# Patient Record
Sex: Male | Born: 1975 | Race: White | Hispanic: No | Marital: Married | Smoking: Never smoker
Health system: Southern US, Community
[De-identification: ages and names within clinical notes are randomized; demographics above are authoritative.]

## PROBLEM LIST (undated history)

## (undated) ENCOUNTER — Inpatient Hospital Stay (AMBULATORY_SURGERY_CENTER): Payer: 59 | Admitting: Podiatry

## (undated) DIAGNOSIS — Z86718 Personal history of other venous thrombosis and embolism: Secondary | ICD-10-CM

## (undated) DIAGNOSIS — K589 Irritable bowel syndrome without diarrhea: Secondary | ICD-10-CM

## (undated) HISTORY — DX: Irritable bowel syndrome, unspecified: K58.9

## (undated) HISTORY — PX: VASECTOMY: SHX75

## (undated) HISTORY — DX: Personal history of other venous thrombosis and embolism: Z86.718

## (undated) HISTORY — PX: SHOULDER SURGERY: SHX246

## (undated) HISTORY — PX: KNEE ARTHROSCOPY W/ MENISCAL REPAIR: SHX1877

## (undated) HISTORY — PX: ANTERIOR CRUCIATE LIGAMENT REPAIR: SHX115

## (undated) HISTORY — PX: ANKLE SURGERY: SHX546

---

## 1997-11-12 ENCOUNTER — Ambulatory Visit (HOSPITAL_COMMUNITY): Admission: RE | Admit: 1997-11-12 | Discharge: 1997-11-12 | Payer: Self-pay

## 1999-03-20 ENCOUNTER — Encounter: Payer: Self-pay | Admitting: Emergency Medicine

## 1999-03-20 ENCOUNTER — Emergency Department (HOSPITAL_COMMUNITY): Admission: EM | Admit: 1999-03-20 | Discharge: 1999-03-20 | Payer: Self-pay | Admitting: Emergency Medicine

## 1999-04-14 ENCOUNTER — Ambulatory Visit (HOSPITAL_BASED_OUTPATIENT_CLINIC_OR_DEPARTMENT_OTHER): Admission: RE | Admit: 1999-04-14 | Discharge: 1999-04-14 | Payer: Self-pay | Admitting: Orthopaedic Surgery

## 1999-09-01 ENCOUNTER — Ambulatory Visit (HOSPITAL_BASED_OUTPATIENT_CLINIC_OR_DEPARTMENT_OTHER): Admission: RE | Admit: 1999-09-01 | Discharge: 1999-09-01 | Payer: Self-pay | Admitting: Orthopaedic Surgery

## 2010-06-09 ENCOUNTER — Observation Stay (HOSPITAL_COMMUNITY): Admission: EM | Admit: 2010-06-09 | Discharge: 2010-06-09 | Payer: Self-pay | Admitting: Emergency Medicine

## 2010-09-23 LAB — DIFFERENTIAL
Basophils Absolute: 0 10*3/uL (ref 0.0–0.1)
Basophils Relative: 0 % (ref 0–1)
Eosinophils Absolute: 0.1 10*3/uL (ref 0.0–0.7)
Eosinophils Relative: 1 % (ref 0–5)
Lymphocytes Relative: 18 % (ref 12–46)
Lymphs Abs: 1.9 10*3/uL (ref 0.7–4.0)
Monocytes Absolute: 1.5 10*3/uL — ABNORMAL HIGH (ref 0.1–1.0)
Monocytes Relative: 14 % — ABNORMAL HIGH (ref 3–12)
Neutro Abs: 7.1 10*3/uL (ref 1.7–7.7)
Neutrophils Relative %: 67 % (ref 43–77)

## 2010-09-23 LAB — POCT I-STAT, CHEM 8
BUN: 19 mg/dL (ref 6–23)
Calcium, Ion: 1.1 mmol/L — ABNORMAL LOW (ref 1.12–1.32)
Chloride: 104 mEq/L (ref 96–112)
Creatinine, Ser: 1.3 mg/dL (ref 0.4–1.5)
Glucose, Bld: 94 mg/dL (ref 70–99)
HCT: 49 % (ref 39.0–52.0)
Hemoglobin: 16.7 g/dL (ref 13.0–17.0)
Potassium: 4 mEq/L (ref 3.5–5.1)
Sodium: 140 mEq/L (ref 135–145)
TCO2: 27 mmol/L (ref 0–100)

## 2010-09-23 LAB — CBC
HCT: 46 % (ref 39.0–52.0)
Hemoglobin: 16.2 g/dL (ref 13.0–17.0)
MCH: 31.3 pg (ref 26.0–34.0)
MCHC: 35.2 g/dL (ref 30.0–36.0)
MCV: 88.8 fL (ref 78.0–100.0)
Platelets: 248 10*3/uL (ref 150–400)
RBC: 5.18 MIL/uL (ref 4.22–5.81)
RDW: 12.2 % (ref 11.5–15.5)
WBC: 10.6 10*3/uL — ABNORMAL HIGH (ref 4.0–10.5)

## 2010-09-23 LAB — APTT: aPTT: 33 seconds (ref 24–37)

## 2010-09-23 LAB — PROTIME-INR
INR: 1.08 (ref 0.00–1.49)
Prothrombin Time: 14.2 seconds (ref 11.6–15.2)

## 2010-10-08 ENCOUNTER — Inpatient Hospital Stay (INDEPENDENT_AMBULATORY_CARE_PROVIDER_SITE_OTHER)
Admission: RE | Admit: 2010-10-08 | Discharge: 2010-10-08 | Disposition: A | Discharge status: Home / Self Care | Payer: BC Managed Care – PPO | Source: Ambulatory Visit | Attending: Emergency Medicine | Admitting: Emergency Medicine

## 2010-10-08 DIAGNOSIS — T148XXA Other injury of unspecified body region, initial encounter: Secondary | ICD-10-CM

## 2010-11-27 NOTE — Op Note (Signed)
[place]  Patient:    Antonio Davis, Antonio Davis                       MRN: [id number] Proc. Date: 09/01/99 Adm. Date:  [id number] Attending:  Marcene Corning                           Operative Report  PREOPERATIVE DIAGNOSIS:  Left shoulder impingement.  POSTOPERATIVE DIAGNOSIS: 1. Left shoulder impingement. 2. Left shoulder labral tear.  OPERATION PERFORMED: 1. Left shoulder arthroscopic acromioplasty. 2. Left shoulder arthroscopic debridement.  ANESTHESIA:  General.  ATTENDING SURGEON:  Lubertha Basque. Jerl Santos, M.D.  ASSISTANT:  Lindwood Qua, P.A.  INDICATIONS FOR PROCEDURE:  The patient is a 35 year old man many months out from a work-related injury.  He is status post right ACL reconstruction and has been followed for his left shoulder for some time.  He responded in a transient fashion to injection of the subacromial space on two occasions.  He has had an MRI scan  which shows some tendinosis and a downsloping acromion but no frank cuff tear.  Planned procedure at this point is for decompression through the scope.  The procedure was discussed with the patient and informed operative consent was obtained after discussion of possible complications of reaction to anesthesia and infection.  DESCRIPTION OF PROCEDURE:  The patient was taken to an operating suite where general anesthetic was applied without difficulty.  He was then positioned in beach chair position and prepped and draped in normal sterile fashion.  After the administration of preop IV antibiotics, an arthroscopy was performed of the left shoulder through a total of portals.  The glenohumeral joint showed no degenerative change.  He did have a labral tear on the superior aspect involving the anterior portion of the biceps anchor though this structure was well attached.  A debridement of this area was performed.  The middle and inferior glenohumeral ligaments were intact and  healthy appearing.  The rotator cuff appeared benign n undersurface inspection.  The subacromial space was entered and he has a large subacromial spur which was decompressed back to a flat surface done initially with the bur in the lateral position and it was then transferred to the posterior position.  The cuff was thoroughly examined and was found to have some abrasion but no full thickness tear.  The [facility] joint was benign and was not addressed.  The shoulder was thoroughly irrigated at the end of the case followed by placement f Marcaine with epinephrine and morphine.  Simple sutures of nylon were used to reapproximate the three portals followed by Adaptic and a dry gauze dressing with tape.  Estimated blood loss and intraoperative fluids can be obtained from Anesthesia records.  DISPOSITION:  The patient was taken to the recovery room in stable condition. Plans were for him to go home the same day and to follow up in the office in less than a week.  I will contact him by phone tonight. DD:  09/01/99 TD:  09/02/99 Job: [id number]

## 2016-12-09 ENCOUNTER — Ambulatory Visit: Payer: Self-pay | Admitting: Internal Medicine

## 2016-12-09 DIAGNOSIS — Z0289 Encounter for other administrative examinations: Secondary | ICD-10-CM

## 2016-12-15 ENCOUNTER — Ambulatory Visit: Payer: Self-pay | Admitting: Adult Health

## 2017-03-15 ENCOUNTER — Encounter: Payer: Self-pay | Admitting: Adult Health

## 2017-03-15 ENCOUNTER — Ambulatory Visit (INDEPENDENT_AMBULATORY_CARE_PROVIDER_SITE_OTHER): Payer: 59 | Admitting: Adult Health

## 2017-03-15 VITALS — BP 140/88 | HR 86 | Ht 73.0 in | Wt 306.9 lb

## 2017-03-15 DIAGNOSIS — Z131 Encounter for screening for diabetes mellitus: Secondary | ICD-10-CM | POA: Diagnosis not present

## 2017-03-15 DIAGNOSIS — Z9229 Personal history of other drug therapy: Secondary | ICD-10-CM | POA: Diagnosis not present

## 2017-03-15 DIAGNOSIS — Z13220 Encounter for screening for lipoid disorders: Secondary | ICD-10-CM | POA: Insufficient documentation

## 2017-03-15 DIAGNOSIS — K589 Irritable bowel syndrome without diarrhea: Secondary | ICD-10-CM | POA: Insufficient documentation

## 2017-03-15 DIAGNOSIS — K588 Other irritable bowel syndrome: Secondary | ICD-10-CM

## 2017-03-15 DIAGNOSIS — Z0000 Encounter for general adult medical examination without abnormal findings: Secondary | ICD-10-CM | POA: Diagnosis not present

## 2017-03-15 DIAGNOSIS — Z86718 Personal history of other venous thrombosis and embolism: Secondary | ICD-10-CM | POA: Insufficient documentation

## 2017-03-15 NOTE — Assessment & Plan Note (Addendum)
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 Current regime: Coumadin 10mg - M/W/F/Sat. Coumadin 5mg - T/R/Sun.   Last INR 90 days ago, level was 2.5 Referral to coumadin clinic placed.

## 2017-03-15 NOTE — Assessment & Plan Note (Signed)
Was previously treated with Eluxadoline BID, however due to cost has not been on medication in years. He resorts to eating only one meal a day to reduce frequency of diarrhea. He has never been treated/evaluated by GI. He denies family hx of colon ca. He declines refill on Eluxadoline today.

## 2017-03-15 NOTE — Assessment & Plan Note (Signed)
Will check lipids. 

## 2017-03-15 NOTE — Patient Instructions (Signed)
Bleeding Precautions When on Anticoagulant Therapy  WHAT IS ANTICOAGULANT THERAPY? Anticoagulant therapy is taking medicine to prevent or reduce blood clots. It is also called blood thinner therapy. Blood clots that form in your blood vessels can be dangerous. They can break loose and travel to your heart, lungs, or brain. This increases your risk of a heart attack or stroke. Anticoagulant therapy causes blood to clot more slowly. You may need anticoagulant therapy if you have:  A medical condition that increases the likelihood that blood clots will form.  A heart defect or a problem with heart rhythm. It is also a common treatment after heart surgery, such as valve replacement. WHAT ARE COMMON TYPES OF ANTICOAGULANT THERAPY? Anticoagulant medicine can be injected or taken by mouth.If you need anticoagulant therapy quickly at the hospital, the medicine may be injected under your skin or given through an IV tube. Heparin is a common example of an anticoagulant that you may get at the hospital. Most anticoagulant therapy is in the form of pills that you take at home every day. These may include:  Aspirin. This common blood thinner works by preventing blood cells (platelets) from sticking together to form a clot. Aspirin is not as strong as anticoagulants that slow down the time that it takes for your body to form a clot.  Clopidogrel. This is a newer type of drug that affects platelets. It is stronger than aspirin.  Warfarin. This is the most common anticoagulant. It changes the way your body uses vitamin K, a vitamin that helps your blood to clot. The risk of bleeding is higher with warfarin than with aspirin. You will need frequent blood tests to make sure you are taking the safest amount.  New anticoagulants. Several new drugs have been approved. They are all taken by mouth. Studies show that these drugs work as well as warfarin. They do not require blood testing. They may cause less bleeding  risk than warfarin. WHAT DO I NEED TO REMEMBER WHEN TAKING ANTICOAGULANT THERAPY? Anticoagulant therapy decreases your risk of forming a blood clot, but it increases your risk of bleeding. Work closely with your health care provider to make sure you are taking your medicine safely. These tips can help:  Learn ways to reduce your risk of bleeding.  If you are taking warfarin: ? Have blood tests as ordered by your health care provider. ? Do not make any sudden changes to your diet. Vitamin K in your diet can make warfarin less effective. ? Do not get pregnant. This medicine may cause birth defects.  Take your medicine at the same time every day. If you forget to take your medicine, take it as soon as you remember. If you miss a whole day, do not double your dose of medicine. Take your normal dose and call your health care provider to check in.  Do not stop taking your medicine on your own.  Tell your health care provider before you start taking any new medicine, vitamin, or herbal product. Some of these could interfere with your therapy.  Tell all of your health care providers that you are on anticoagulant therapy.  Do not have surgery, medical procedures, or dental work until you tell your health care provider that you are on anticoagulant therapy. WHAT CAN AFFECT HOW ANTICOAGULANTS WORK? Certain foods, vitamins, medicines, supplements, and herbal medicines change the way that anticoagulant therapy works. They may increase or decrease the effects of your anticoagulant therapy. Either result can be dangerous for you.    Many over-the-counter medicines for pain, colds, or stomach problems interfere with anticoagulant therapy. Take these only as told by your health care provider.  Do not drink alcohol. It can interfere with your medicine and increase your risk of an injury that causes bleeding.  If you are taking warfarin, do not begin eating more foods that contain vitamin K. These include  leafy green vegetables. Ask your health care provider if you should avoid any foods. WHAT ARE SOME WAYS TO PREVENT BLEEDING? You can prevent bleeding by taking certain precautions:  Be extra careful when you use knives, scissors, or other sharp objects.  Use an electric razor instead of a blade.  Do not use toothpicks.  Use a soft toothbrush.  Wear shoes that have nonskid soles.  Use bath mats and handrails in your bathroom.  Wear gloves while you do yard work.  Wear a helmet when you ride a bike.  Wear your seat belt.  Prevent falls by removing loose rugs and extension cords from areas where you walk.  Do not play contact sports or participate in other activities that have a high risk of injury. Antonio Davis PROVIDER? Call your health care provider if:  You miss a dose of medicine: ? And you are not sure what to do. ? For more than one day.  You have: ? Menstrual bleeding that is heavier than normal. ? Blood in your urine. ? A bloody nose or bleeding gums. ? Easy bruising. ? Blood in your stool (feces) or have black and tarry stool. ? Side effects from your medicine.  You feel weak or dizzy.  You become pregnant. Seek immediate medical care if:  You have bleeding that will not stop.  You have sudden and severe headache or belly pain.  You vomit or you cough up bright red blood.  You have a severe blow to your head. WHAT ARE SOME QUESTIONS TO ASK MY HEALTH CARE PROVIDER?  What is the best anticoagulant therapy for my condition?  What side effects should I watch for?  When should I take my medicine? What should I do if I forget to take it?  Will I need to have regular blood tests?  Do I need to change my diet? Are there foods or drinks that I should avoid?  What activities are safe for me?  What should I do if I want to get pregnant? This information is not intended to replace advice given to you by your health care provider.  Make sure you discuss any questions you have with your health care provider. Document Released: 06/09/2015 Document Reviewed: 06/09/2015 Elsevier Interactive Patient Education  2017 [employer].   Heart-Healthy Eating Plan Many factors influence your heart health, including eating and exercise habits. Heart (coronary) risk increases with abnormal blood fat (lipid) levels. Heart-healthy meal planning includes limiting unhealthy fats, increasing healthy fats, and making other small dietary changes. This includes maintaining a healthy body weight to help keep lipid levels within a normal range. What is my plan? Your health care provider recommends that you:  Get no more than __25__% of the total calories in your daily diet from fat.  Limit your intake of saturated fat to less than __5___% of your total calories each day.  Limit the amount of cholesterol in your diet to less than __300__ mg per day.  What types of fat should I choose?  Choose healthy fats more often. Choose monounsaturated and polyunsaturated fats, such as olive  oil and canola oil, flaxseeds, walnuts, almonds, and seeds.  Eat more omega-3 fats. Good choices include salmon, mackerel, sardines, tuna, flaxseed oil, and ground flaxseeds. Aim to eat fish at least two times each week.  Limit saturated fats. Saturated fats are primarily found in animal products, such as meats, butter, and cream. Plant sources of saturated fats include palm oil, palm kernel oil, and coconut oil.  Avoid foods with partially hydrogenated oils in them. These contain trans fats. Examples of foods that contain trans fats are stick margarine, some tub margarines, cookies, crackers, and other baked goods. What general guidelines do I need to follow?  Check food labels carefully to identify foods with trans fats or high amounts of saturated fat.  Fill one half of your plate with vegetables and green salads. Eat 4-5 servings of vegetables per day. A  serving of vegetables equals 1 cup of raw leafy vegetables,  cup of raw or cooked cut-up vegetables, or  cup of vegetable juice.  Fill one fourth of your plate with whole grains. Look for the word "whole" as the first word in the ingredient list.  Fill one fourth of your plate with lean protein foods.  Eat 4-5 servings of fruit per day. A serving of fruit equals one medium whole fruit,  cup of dried fruit,  cup of fresh, frozen, or canned fruit, or  cup of 100% fruit juice.  Eat more foods that contain soluble fiber. Examples of foods that contain this type of fiber are apples, broccoli, carrots, beans, peas, and barley. Aim to get 20-30 g of fiber per day.  Eat more home-cooked food and less restaurant, buffet, and fast food.  Limit or avoid alcohol.  Limit foods that are high in starch and sugar.  Avoid fried foods.  Cook foods by using methods other than frying. Baking, boiling, grilling, and broiling are all great options. Other fat-reducing suggestions include: ? Removing the skin from poultry. ? Removing all visible fats from meats. ? Skimming the fat off of stews, soups, and gravies before serving them. ? Steaming vegetables in water or broth.  Lose weight if you are overweight. Losing just 5-10% of your initial body weight can help your overall health and prevent diseases such as diabetes and heart disease.  Increase your consumption of nuts, legumes, and seeds to 4-5 servings per week. One serving of dried beans or legumes equals  cup after being cooked, one serving of nuts equals 1 ounces, and one serving of seeds equals  ounce or 1 tablespoon.  You may need to monitor your salt (sodium) intake, especially if you have high blood pressure. Talk with your health care provider or dietitian to get more information about reducing sodium. What foods can I eat? Grains  Breads, including [place], white, pita, wheat, raisin, rye, oatmeal, and [place]. Tortillas that are  neither fried nor made with lard or trans fat. Low-fat rolls, including hotdog and hamburger buns and English muffins. Biscuits. Muffins. Waffles. Pancakes. Light popcorn. Whole-grain cereals. Flatbread. Melba toast. Pretzels. Breadsticks. Rusks. Low-fat snacks and crackers, including oyster, saltine, matzo, graham, animal, and rye. Rice and pasta, including brown rice and those that are made with whole wheat. Vegetables All vegetables. Fruits All fruits, but limit coconut. Meats and Other Protein Sources Lean, well-trimmed beef, veal, pork, and lamb. Chicken and [place] without skin. All fish and shellfish. Wild duck, rabbit, pheasant, and venison. Egg whites or low-cholesterol egg substitutes. Dried beans, peas, lentils, and tofu.Seeds and most nuts. Dairy  Low-fat or nonfat cheeses, including ricotta, string, and mozzarella. Skim or 1% milk that is liquid, powdered, or evaporated. Buttermilk that is made with low-fat milk. Nonfat or low-fat yogurt. Beverages Mineral water. Diet carbonated beverages. Sweets and Desserts Sherbets and fruit ices. Honey, jam, marmalade, jelly, and syrups. Meringues and gelatins. Pure sugar candy, such as hard candy, jelly beans, gumdrops, mints, marshmallows, and small amounts of dark chocolate. [employer] Eat all sweets and desserts in moderation. Fats and Oils Nonhydrogenated (trans-free) margarines. Vegetable oils, including soybean, sesame, sunflower, olive, peanut, safflower, corn, canola, and cottonseed. Salad dressings or mayonnaise that are made with a vegetable oil. Limit added fats and oils that you use for cooking, baking, salads, and as spreads. Other Cocoa powder. Coffee and tea. All seasonings and condiments. The items listed above may not be a complete list of recommended foods or beverages. Contact your dietitian for more options. What foods are not recommended? Grains Breads that are made with saturated or trans fats, oils, or whole milk.  Croissants. Butter rolls. Cheese breads. Sweet rolls. Donuts. Buttered popcorn. Chow mein noodles. High-fat crackers, such as cheese or butter crackers. Meats and Other Protein Sources Fatty meats, such as hotdogs, short ribs, sausage, spareribs, bacon, ribeye roast or steak, and mutton. High-fat deli meats, such as salami and bologna. Caviar. Domestic duck and goose. Organ meats, such as kidney, liver, sweetbreads, brains, gizzard, chitterlings, and heart. Dairy Cream, sour cream, cream cheese, and creamed cottage cheese. Whole milk cheeses, including blue (bleu), Monterey Jack, [place], [place], American, [place], Swiss, [place], [place], and [place]. Whole or 2% milk that is liquid, evaporated, or condensed. Whole buttermilk. Cream sauce or high-fat cheese sauce. Yogurt that is made from whole milk. Beverages Regular sodas and drinks with added sugar. Sweets and Desserts Frosting. Pudding. Cookies. Cakes other than angel food cake. Candy that has milk chocolate or white chocolate, hydrogenated fat, butter, coconut, or unknown ingredients. Buttered syrups. Full-fat ice cream or ice cream drinks. Fats and Oils Gravy that has suet, meat fat, or shortening. Cocoa butter, hydrogenated oils, palm oil, coconut oil, palm kernel oil. These can often be found in baked products, candy, fried foods, nondairy creamers, and whipped toppings. Solid fats and shortenings, including bacon fat, salt pork, lard, and butter. Nondairy cream substitutes, such as coffee creamers and sour cream substitutes. Salad dressings that are made of unknown oils, cheese, or sour cream. The items listed above may not be a complete list of foods and beverages to avoid. Contact your dietitian for more information. This information is not intended to replace advice given to you by your health care provider. Make sure you discuss any questions you have with your health care provider. Document Released: 04/06/2008 Document Revised: 01/16/2016  Document Reviewed: 12/20/2013 Elsevier Interactive Patient Education  2017 [employer].  Please continue Coumadin as directed. Referral for coumadin clinic placed. Keep up the excellent water intake and try to eat at least 3 meals/day. Please schedule physical with fasting labs this fall. WELCOME TO THE PRACTICE!

## 2017-03-15 NOTE — Assessment & Plan Note (Signed)
Coumadin clinic referral placed. Heart healthy diet and increase regular exercise. Continue to abstain from EOTH/tobacco use. Recommend CPE with fasting labs this fall.

## 2017-03-15 NOTE — Progress Notes (Signed)
Subjective:    Patient ID: Antonio Davis, male    DOB: 14-Mar-1976, 41 y.o.   MRN: [id number]  HPI:  Antonio Davis, Antonio Davis is here to establish as a new pt.  He is a very pleasant 41 year old male. 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  He has sig family hx of clotting disorders- father and brother.  Due to this he has opted to remain on Coumadin lifelong.  His current therapy: Coumadin 10mg - M/W/F/Sat. Coumadin 5mg - T/R/Sun.  He has been therapeutic for years and his INR typically runs 2-2.5, last check was 90 days ago and level was 2.5.  His INRs were only being monitoring every 90 days. He denies tobacco/EOTH use.  He reports drinking >6L water/day and only eats one meal a day.  He denies tobacco/EOTH use.  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 .  Patient Care Team    Relationship Specialty Notifications Start End  [place], Berna Spare, NP PCP - General Family Medicine  03/10/17     Patient Active Problem List   Diagnosis Date Noted  . History of blood clots 03/15/2017  . IBS (irritable bowel syndrome) 03/15/2017  . History of Coumadin therapy 03/15/2017  . Screening cholesterol level 03/15/2017  . Screening for diabetes mellitus 03/15/2017  . Healthcare maintenance 03/15/2017     Past Medical History:  Diagnosis Date  . History of blood clots   . IBS (irritable bowel syndrome)      Past Surgical History:  Procedure Laterality Date  . KNEE ARTHROSCOPY W/ MENISCAL REPAIR       Family History  Problem Relation Age of Onset  . Clotting disorder Father   . Clotting disorder Brother      History  Drug Use No     History  Alcohol Use No     History  Smoking Status  . Never Smoker  Smokeless Tobacco  . Never Used     Outpatient Encounter Prescriptions as of 03/15/2017  Medication Sig  . warfarin (COUMADIN) 5 MG tablet Take 5 mg by mouth daily. 10 mg MWF and Sat  5 mg Tue, Thurs and Sun  . [DISCONTINUED]  Eluxadoline (VIBERZI PO) Take 1 tablet by mouth as needed.   No facility-administered encounter medications on file as of 03/15/2017.     Allergies: Patient has no allergy information on record.  Body mass index is 40.49 kg/m.  Blood pressure 140/88, pulse 86, height 6\' 1"  (1.854 m), weight (!) 306 lb 14.4 oz (139.2 kg).     Review of Systems  Constitutional: Positive for fatigue. Negative for activity change, appetite change, chills, diaphoresis, fever and unexpected weight change.  HENT: Negative.   Eyes: Negative.   Respiratory: Negative for cough, chest tightness, shortness of breath, wheezing and stridor.   Cardiovascular: Negative for chest pain, palpitations and leg swelling.  Gastrointestinal: Negative.   Endocrine: Negative.   Genitourinary: Negative.   Musculoskeletal: Negative.   Skin: Negative.   Allergic/Immunologic: Negative.   Neurological: Negative for dizziness, tremors, weakness and headaches.  Hematological: Bruises/bleeds easily.  Psychiatric/Behavioral: Positive for sleep disturbance. Negative for dysphoric mood. The patient is not nervous/anxious.        Objective:   Physical Exam  Constitutional: He is oriented to person, place, and time. He appears well-developed and well-nourished. No distress.  HENT:  Head: Normocephalic and atraumatic.  Right Ear: External ear normal.  Left Ear: External ear normal.  Eyes:  Pupils are equal, round, and reactive to light. Conjunctivae are normal.  Neck: Normal range of motion. Neck supple.  Cardiovascular: Normal rate, regular rhythm, normal heart sounds and intact distal pulses.   No murmur heard. Pulmonary/Chest: Effort normal and breath sounds normal. No respiratory distress. He has no wheezes. He has no rales. He exhibits no tenderness.  Lymphadenopathy:    He has no cervical adenopathy.  Neurological: He is alert and oriented to person, place, and time.  Skin: Skin is warm and dry. No rash noted. He is not  diaphoretic. No erythema. No pallor.  Psychiatric: He has a normal mood and affect. His behavior is normal. Judgment and thought content normal.  Nursing note and vitals reviewed.         Assessment & Plan:   1. Screening for diabetes mellitus   2. History of blood clots   3. Other irritable bowel syndrome   4. Screening cholesterol level   5. History of Coumadin therapy   6. Healthcare maintenance     IBS (irritable bowel syndrome) Was previously treated with Eluxadoline BID, however due to cost has not been on medication in years. He resorts to eating only one meal a day to reduce frequency of diarrhea. He has never been treated/evaluated by GI. He denies family hx of colon ca. He declines refill on Eluxadoline today.  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 Current regime: Coumadin 10mg - M/W/F/Sat. Coumadin 5mg - T/R/Sun.   Last INR 90 days ago, level was 2.5 Referral to coumadin clinic placed.    Screening cholesterol level Will check lipids  Screening for diabetes mellitus Will check A1c  Healthcare maintenance Coumadin clinic referral placed. Heart healthy diet and increase regular exercise. Continue to abstain from EOTH/tobacco use. Recommend CPE with fasting labs this fall.     FOLLOW-UP:  Return in about 4 weeks (around 04/12/2017) for CPE, Fasting Lab Draw.

## 2017-03-15 NOTE — Assessment & Plan Note (Signed)
Will check A1c

## 2017-04-04 NOTE — Progress Notes (Signed)
Noted.  Pt has appt with Coumadin clinic on 04/12/17.  Charyl Bigger, CMA

## 2017-04-06 ENCOUNTER — Other Ambulatory Visit: Payer: Self-pay | Admitting: Adult Health

## 2017-04-06 NOTE — Telephone Encounter (Signed)
Pt's wife clld states Patient should have advised provider he was out of his Warfarin 5 MG (takes 2 daily) Rx-(prior provider was prescriber/ Not  Katy)-- Pt uses [facility] on [address] as pharmacy -- pls cll patient if any questions--- He is out complete and having leg cramps ( hx of blood clots) --glh

## 2017-04-07 NOTE — Telephone Encounter (Signed)
We have not prescribed these medications for the patient previously.  Please review and refill if appropriate.  T. Talesha Ellithorpe, CMA  

## 2017-04-08 ENCOUNTER — Other Ambulatory Visit: Payer: Self-pay | Admitting: Adult Health

## 2017-04-08 DIAGNOSIS — Z86718 Personal history of other venous thrombosis and embolism: Secondary | ICD-10-CM

## 2017-04-08 MED ORDER — WARFARIN SODIUM 5 MG PO TABS: 5.0000 mg | ORAL_TABLET | Freq: Every day | ORAL | 0 refills | Status: DC

## 2017-04-08 NOTE — Progress Notes (Unsigned)
He has appt with [facility] 04/12/2017 Refill sent in, will be looking for INR next week. Will need current INR prior to any refills.

## 2017-04-08 NOTE — Telephone Encounter (Signed)
Morning Odette Fraction and I verified that he has appt with [facility] 04/12/2017. I sent in refill, however we will need INRs at LEAST every 90 days prior to any additional refills. Can you please call and let Mr. Panas aware. Thanks! Valetta Fuller

## 2017-04-11 NOTE — Telephone Encounter (Signed)
Pt made aware.  Pt expressed understanding.  Charyl Bigger, CMA

## 2017-04-12 ENCOUNTER — Encounter: Payer: Self-pay | Admitting: Physician Assistant

## 2017-04-12 ENCOUNTER — Ambulatory Visit (INDEPENDENT_AMBULATORY_CARE_PROVIDER_SITE_OTHER): Payer: 59 | Admitting: *Deleted

## 2017-04-12 ENCOUNTER — Ambulatory Visit (INDEPENDENT_AMBULATORY_CARE_PROVIDER_SITE_OTHER): Payer: 59 | Admitting: Physician Assistant

## 2017-04-12 VITALS — BP 136/60 | HR 84 | Ht 73.0 in | Wt 306.8 lb

## 2017-04-12 DIAGNOSIS — Z7901 Long term (current) use of anticoagulants: Secondary | ICD-10-CM | POA: Diagnosis not present

## 2017-04-12 DIAGNOSIS — Z9229 Personal history of other drug therapy: Secondary | ICD-10-CM | POA: Diagnosis not present

## 2017-04-12 DIAGNOSIS — I82499 Acute embolism and thrombosis of other specified deep vein of unspecified lower extremity: Secondary | ICD-10-CM

## 2017-04-12 DIAGNOSIS — Z5181 Encounter for therapeutic drug level monitoring: Secondary | ICD-10-CM

## 2017-04-12 DIAGNOSIS — I82409 Acute embolism and thrombosis of unspecified deep veins of unspecified lower extremity: Secondary | ICD-10-CM | POA: Insufficient documentation

## 2017-04-12 DIAGNOSIS — Z86718 Personal history of other venous thrombosis and embolism: Secondary | ICD-10-CM | POA: Diagnosis not present

## 2017-04-12 DIAGNOSIS — D6859 Other primary thrombophilia: Secondary | ICD-10-CM | POA: Insufficient documentation

## 2017-04-12 LAB — POCT INR: INR: 1.9

## 2017-04-12 NOTE — Patient Instructions (Signed)
Medication Instructions:  No changes  Labwork: None   Testing/Procedures: None   Follow-Up: As needed with Cardiology Richardson Dopp, PA-C).  Any Other Special Instructions Will Be Listed Below (If Applicable).  If you need a refill on your cardiac medications before your next appointment, please call your pharmacy.

## 2017-04-12 NOTE — Patient Instructions (Signed)

## 2017-04-12 NOTE — Progress Notes (Signed)
Cardiology Office Note:    Date:  04/12/2017   ID:  Antonio Davis, DOB 02/28/1976, MRN [id number]  PCP:  Esaw Grandchild, NP  Cardiologist:  New   Referring MD: Esaw Grandchild, NP   Chief Complaint  Patient presents with  . Anticoagulation    History of Present Illness:    Antonio Davis is a 41 y.o. 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   Mr. Linck has a hx of R leg DVT in 2010. He has a strong family history of hypercoagulable disorder.  Therefore, he has remained on Coumadin since.  He has no complaints.  He has not had chest pain, shortness of breath, syncope, paroxysmal nocturnal dyspnea, significant edema.   PAD Screen 04/12/2017  Previous PAD dx? No  Previous surgical procedure? No  Pain with walking? No  Feet/toe relief with dangling? No  Painful, non-healing ulcers? No  Extremities discolored? No    Prior CV studies:   The following studies were reviewed today:  None   Past Medical History:  Diagnosis Date  . History of DVT (deep vein thrombosis)    R calf; 2010; on Coumadin since  . IBS (irritable bowel syndrome)     Past Surgical History:  Procedure Laterality Date  . ANTERIOR CRUCIATE LIGAMENT REPAIR Bilateral   . KNEE ARTHROSCOPY W/ MENISCAL REPAIR    . SHOULDER SURGERY     2/2 fracture    Current Medications: Current Meds  Medication Sig  . warfarin (COUMADIN) 5 MG tablet Take 1 tablet (5 mg total) by mouth daily. 10 mg MWF and Sat  5 mg Tue, Thurs and Sun     Allergies:   Patient has no known allergies.   Social History   Social History  . Marital status: Married    Spouse name: N/A  . Number of children: N/A  . Years of education: N/A   Social History Main Topics  . Smoking status: Never Smoker  . Smokeless tobacco: Never Used  . Alcohol use No    . Drug use: No  . Sexual activity: Not Asked   Other Topics Concern  . None   Social History Narrative   Works for [employer], maintenance   Married.  In process of adopting child.        Family Hx: The patient's family history includes Clotting disorder in his brother and father.  ROS:   Please see the history of present illness.    ROS All other systems reviewed and are negative.   EKGs/Labs/Other Test Reviewed:    EKG:  EKG is not ordered today.  The ekg ordered today demonstrates n/a  Recent Labs: No results found for requested labs within last 8760 hours.   Recent Lipid Panel No results found for: CHOL, TRIG, HDL, CHOLHDL, LDLCALC, LDLDIRECT  Physical Exam:    VS:  BP 136/60 (BP Location: Left Arm, Patient Position: Sitting, Cuff Size: Large)   Pulse 84   Ht 6\' 1"  (1.854 m)   Wt (!) 306 lb 12.8 oz (139.2 kg)   BMI 40.48 kg/m     Wt Readings from Last 3 Encounters:  04/12/17 (!) 306 lb 12.8 oz (139.2 kg)  03/15/17 (!) 306 lb 14.4 oz (139.2 kg)     Physical Exam  Constitutional: He is  oriented to person, place, and time. He appears well-developed and well-nourished. No distress.  HENT:  Head: Normocephalic and atraumatic.  Eyes: No scleral icterus.  Neck: No JVD present. Carotid bruit is not present.  Cardiovascular: Normal rate and regular rhythm.   No murmur heard. Pulmonary/Chest: Effort normal. He has no rales.  Abdominal: Soft.  Musculoskeletal: He exhibits no edema.  Neurological: He is alert and oriented to person, place, and time.  Skin: Skin is warm and dry.  Psychiatric: He has a normal mood and affect.    ASSESSMENT:    1. History of DVT (deep vein thrombosis)   2. Anticoagulated on Coumadin    PLAN:    In order of problems listed above:  1. History of DVT (deep vein thrombosis) He has been on Coumadin since his first DVT in 2010.  He decided to remain on it due to his family hx of hypercoagulable disorder.   2.  Anticoagulated on Coumadin He prefers to have his Coumadin managed by primary care.  The co-pay is expensive here as we are a Cardiology office.  He has not had Coumadin in several days.  Our Coumadin Clinic will check his INR today, refill his Coumadin and manage his INR until he can get into a primary care office.   Dispo:  Return As needed.   Medication Adjustments/Labs and Tests Ordered: Current medicines are reviewed at length with the patient today.  Concerns regarding medicines are outlined above.  Orders/Tests:  No orders of the defined types were placed in this encounter.  Medication changes: No orders of the defined types were placed in this encounter.  Signed, Richardson Dopp, PA-C  04/12/2017 4:03 PM    [facility] [address], [place], [place]  [address] Phone: [phone]; Fax: [phone]

## 2017-04-15 NOTE — Addendum Note (Signed)
Addended by: Briant Cedar on: 04/15/2017 12:47 PM   Modules accepted: Orders

## 2017-04-18 ENCOUNTER — Ambulatory Visit: Payer: 59 | Admitting: Family Medicine

## 2017-04-19 ENCOUNTER — Encounter: Payer: 59 | Admitting: Adult Health

## 2017-04-21 ENCOUNTER — Other Ambulatory Visit: Payer: Self-pay | Admitting: Adult Health

## 2017-04-21 DIAGNOSIS — Z86718 Personal history of other venous thrombosis and embolism: Secondary | ICD-10-CM

## 2017-04-21 MED ORDER — WARFARIN SODIUM 5 MG PO TABS: ORAL_TABLET | ORAL | 0 refills | Status: DC

## 2017-04-21 NOTE — Progress Notes (Signed)
Good Morning Tonya, Can you please call Mr. Tweed and tell him his last INR was 1.9, just sub therapuetic. Please adjust coumadin- M/W/F/sat- 10mg  T/thurs/saun -5mg  So the change is sat increased from 5 to 10mg  to try and bump his INR between 2-3. Please tell him to avoid vit K+ in diet. He will need to have his INR re-checked in 1 week (can you please put in order) Once we get him back to INR 2-3, we can resume 90 day INRs. Thanks! Moksh Loomer PS- I already updated rx change in system

## 2017-04-25 NOTE — Progress Notes (Signed)
LVM for pt to call to discuss.  T. Aidden Markovic, CMA  

## 2017-04-27 ENCOUNTER — Telehealth: Payer: Self-pay

## 2017-04-27 NOTE — Telephone Encounter (Signed)
Called Antonio Davis and advised him of new Coumadin dosing.  However, Antonio Davis was upset that he had to go to the Coumadin Clinic for an INR and pay a $50 copay.  Advised Antonio Davis that we can do a venipuncture in our office and send to the lab, but that we do not have a POC Coagucheck.  Antonio Davis stated that he is going to transfer his care back to Dr. Rex Kras because they can do an INR in their office.  Mina Marble, NP informed.  She stated that Antonio Davis is NOT to receive any further refills from our office.  Charyl Bigger, CMA

## 2017-04-27 NOTE — Telephone Encounter (Signed)
-----   Message from Fonnie Mu, [place] sent at 04/25/2017 10:31 AM EDT -----   ----- Message ----- From: Esaw Grandchild, NP Sent: 04/21/2017  11:17 AM To: Fonnie Mu, CMA    ----- Message ----- From: Sharmon Revere Sent: 04/12/2017   4:03 PM To: Esaw Grandchild, NP

## 2018-01-24 ENCOUNTER — Ambulatory Visit: Payer: 59 | Admitting: Podiatry

## 2018-01-24 ENCOUNTER — Encounter: Payer: Self-pay | Admitting: Podiatry

## 2018-01-24 ENCOUNTER — Encounter: Payer: Self-pay | Admitting: *Deleted

## 2018-01-24 ENCOUNTER — Ambulatory Visit (INDEPENDENT_AMBULATORY_CARE_PROVIDER_SITE_OTHER): Payer: 59

## 2018-01-24 VITALS — BP 153/111 | HR 72 | Temp 97.9°F | Resp 16 | Ht 74.0 in | Wt 300.0 lb

## 2018-01-24 DIAGNOSIS — M7661 Achilles tendinitis, right leg: Secondary | ICD-10-CM

## 2018-01-24 DIAGNOSIS — R269 Unspecified abnormalities of gait and mobility: Secondary | ICD-10-CM

## 2018-01-24 DIAGNOSIS — M25571 Pain in right ankle and joints of right foot: Secondary | ICD-10-CM

## 2018-01-24 DIAGNOSIS — R600 Localized edema: Secondary | ICD-10-CM

## 2018-01-24 MED ORDER — METHYLPREDNISOLONE 4 MG PO TBPK: ORAL_TABLET | ORAL | 0 refills | Status: DC

## 2018-01-24 NOTE — Patient Instructions (Signed)

## 2018-01-24 NOTE — Progress Notes (Signed)
  Subjective:  Patient ID: Antonio Davis, male    DOB: 03/28/1976,  MRN: [id number]  Chief Complaint  Patient presents with  . Foot Pain    R back heel pain x couple months; 7/10 sharp constant pain Tx: icing Pt. stated," the pain is worst when I stand up it feels like a tendon is stretching."     42 y.o. male presents with the above complaint. Reports R heel pain for a few months. 7/10 sharp constant pain. Has been icing. Worst pain when he stands up feels like the tendon is tight. On Coumadin for hx of DVT, endorses unknown inherited clotting disorder.  Past Medical History:  Diagnosis Date  . History of DVT (deep vein thrombosis)    R calf; 2010; on Coumadin since  . IBS (irritable bowel syndrome)    Past Surgical History:  Procedure Laterality Date  . ANTERIOR CRUCIATE LIGAMENT REPAIR Bilateral   . KNEE ARTHROSCOPY W/ MENISCAL REPAIR    . SHOULDER SURGERY     2/2 fracture    Current Outpatient Medications:  .  warfarin (COUMADIN) 5 MG tablet, 10 mg MWF, sat and 5 mg Tue, Thurs and Sun, Disp: 140 tablet, Rfl: 0  No Known Allergies Review of Systems: Negative except as noted in the HPI. Denies N/V/F/Ch. Objective:  There were no vitals filed for this visit. General AA&O x3. Normal mood and affect.  Vascular Dorsalis pedis and posterior tibial pulses  present 2+ bilaterally  Capillary refill normal to all digits. Pedal hair growth normal.  Neurologic Epicritic sensation grossly present.  Dermatologic No open lesions. Interspaces clear of maceration. Nails well groomed and normal in appearance.  Orthopedic: MMT 5/5 in dorsiflexion, plantarflexion, inversion, and eversion. Decreased AJ ROM Right with pain on end ROM. POP posterior Achilles tendon insertion. No pain at watershed area. Haglund deformity noted.   XR taken and reviewed. Posterior spurring of the calcaneus noted. Haglund deformity noted.  Assessment & Plan:  Patient was evaluated and treated and all questions  answered.  Achilles Tendonitis, right -XR reviewed as above. -Educated on stretching and icing of the affected limb. -Unna boot and CAM boot applied for immobilization post-injection. Written off from work for the remainder of the week. -eRx for Medrol 6-day taper. Advised on how to take medication. -Avoid NSAIDs due to bleeding risk.  Return in about 3 weeks (around 02/14/2018) for Achilles tendonitis.

## 2018-01-24 NOTE — Progress Notes (Signed)
   Subjective:    Patient ID: Antonio Davis, male    DOB: March 10, 1976, 42 y.o.   MRN: [id number]  HPI    Review of Systems  Musculoskeletal: Positive for joint swelling and myalgias.  All other systems reviewed and are negative.      Objective:   Physical Exam        Assessment & Plan:

## 2018-01-31 ENCOUNTER — Other Ambulatory Visit: Payer: Self-pay | Admitting: Podiatry

## 2018-01-31 DIAGNOSIS — M25571 Pain in right ankle and joints of right foot: Secondary | ICD-10-CM

## 2018-01-31 DIAGNOSIS — M7661 Achilles tendinitis, right leg: Secondary | ICD-10-CM

## 2018-01-31 DIAGNOSIS — R600 Localized edema: Secondary | ICD-10-CM

## 2018-01-31 DIAGNOSIS — R269 Unspecified abnormalities of gait and mobility: Secondary | ICD-10-CM

## 2018-02-03 ENCOUNTER — Telehealth: Payer: Self-pay | Admitting: *Deleted

## 2018-02-03 NOTE — Telephone Encounter (Signed)
Erroneous in counter

## 2018-02-06 ENCOUNTER — Ambulatory Visit: Payer: 59 | Admitting: Podiatry

## 2018-02-06 DIAGNOSIS — M7730 Calcaneal spur, unspecified foot: Secondary | ICD-10-CM

## 2018-02-06 DIAGNOSIS — M659 Unspecified synovitis and tenosynovitis, unspecified site: Secondary | ICD-10-CM

## 2018-02-06 DIAGNOSIS — M7661 Achilles tendinitis, right leg: Secondary | ICD-10-CM

## 2018-02-06 MED ORDER — DICLOFENAC SODIUM 1 % TD GEL: 2.0000 g | Freq: Four times a day (QID) | TRANSDERMAL | 1 refills | Status: DC

## 2018-02-07 NOTE — Progress Notes (Signed)
  Subjective:  Patient ID: SELIG Davis, male    DOB: 10-30-1975,  MRN: [id number]  Chief Complaint  Patient presents with  . Tendonitis    F/U L tendonitis Pt.s tated," wearing the boot helps alot, but when I'm not wearing it it gets sore." Tx: cam boot, stretching, and icing   42 y.o. male returns with the above complaint.  Concerned that he still having pain when he is not wearing the boot.  Has been using lidocaine patches additionally which do help a little bit  Past Medical History:  Diagnosis Date  . History of DVT (deep vein thrombosis)    R calf; 2010; on Coumadin since  . IBS (irritable bowel syndrome)    Past Surgical History:  Procedure Laterality Date  . ANTERIOR CRUCIATE LIGAMENT REPAIR Bilateral   . KNEE ARTHROSCOPY W/ MENISCAL REPAIR    . SHOULDER SURGERY     2/2 fracture    Current Outpatient Medications:  .  diclofenac sodium (VOLTAREN) 1 % GEL, Apply 2 g topically 4 (four) times daily., Disp: 100 g, Rfl: 1 .  methylPREDNISolone (MEDROL DOSEPAK) 4 MG TBPK tablet, 6 Day Taper Pack. Take as Directed., Disp: 21 tablet, Rfl: 0 .  warfarin (COUMADIN) 5 MG tablet, 10 mg MWF, sat and 5 mg Tue, Thurs and Sun, Disp: 140 tablet, Rfl: 0  No Known Allergies Review of Systems: Negative except as noted in the HPI. Denies N/V/F/Ch. Objective:  There were no vitals filed for this visit. General AA&O x3. Normal mood and affect.  Vascular Dorsalis pedis and posterior tibial pulses  present 2+ bilaterally  Capillary refill normal to all digits. Pedal hair growth normal.  Neurologic Epicritic sensation grossly present.  Dermatologic No open lesions. Interspaces clear of maceration. Nails well groomed and normal in appearance.  Orthopedic: MMT 5/5 in dorsiflexion, plantarflexion, inversion, and eversion. Decreased AJ ROM Right with pain on end ROM. POP posterior Achilles tendon insertion. No pain at watershed area. Haglund deformity noted.   Assessment & Plan:  Patient was  evaluated and treated and all questions answered.  Achilles Tendonitis, right -Discussed continued pharmacologic management.  Will avoid NSAIDs due to continued risk of Coumadin.  Will try Voltaren gel for pain and reduction in inflammation -Refer to PT for stretching and reduction of inflammation -Would still consider possible surgical intervention in the future should pain persist  No follow-ups on file.

## 2018-02-14 ENCOUNTER — Ambulatory Visit: Payer: 59 | Admitting: Podiatry

## 2018-03-21 ENCOUNTER — Ambulatory Visit: Payer: 59 | Admitting: Podiatry

## 2018-03-21 DIAGNOSIS — M7730 Calcaneal spur, unspecified foot: Secondary | ICD-10-CM | POA: Diagnosis not present

## 2018-03-21 DIAGNOSIS — Z6838 Body mass index (BMI) 38.0-38.9, adult: Secondary | ICD-10-CM

## 2018-03-21 DIAGNOSIS — D689 Coagulation defect, unspecified: Secondary | ICD-10-CM

## 2018-03-21 DIAGNOSIS — M216X9 Other acquired deformities of unspecified foot: Secondary | ICD-10-CM

## 2018-03-21 DIAGNOSIS — M7661 Achilles tendinitis, right leg: Secondary | ICD-10-CM | POA: Diagnosis not present

## 2018-03-21 DIAGNOSIS — M659 Synovitis and tenosynovitis, unspecified: Secondary | ICD-10-CM | POA: Diagnosis not present

## 2018-03-21 DIAGNOSIS — M9262 Juvenile osteochondrosis of tarsus, left ankle: Secondary | ICD-10-CM | POA: Diagnosis not present

## 2018-03-21 NOTE — Progress Notes (Signed)
  Subjective:  Patient ID: Antonio Davis, male    DOB: 1976/03/30,  MRN: [id number]  Chief Complaint  Patient presents with  . Tendonitis    Pt. stated," pain is the same, have not changed." Tx: icing and voltaren gel -pt have not done PT    42 y.o. male presents with the above complaint. Did not go to PT as he was told by his wife it would make a difference.  Has been icing and occasionally using Voltaren gel.  Would like to discuss possible surgery.  Review of Systems: Negative except as noted in the HPI. Denies N/V/F/Ch.  Past Medical History:  Diagnosis Date  . History of DVT (deep vein thrombosis)    R calf; 2010; on Coumadin since  . IBS (irritable bowel syndrome)     Current Outpatient Medications:  .  diclofenac sodium (VOLTAREN) 1 % GEL, Apply 2 g topically 4 (four) times daily., Disp: 100 g, Rfl: 1 .  methylPREDNISolone (MEDROL DOSEPAK) 4 MG TBPK tablet, 6 Day Taper Pack. Take as Directed., Disp: 21 tablet, Rfl: 0 .  warfarin (COUMADIN) 5 MG tablet, 10 mg MWF, sat and 5 mg Tue, Thurs and Sun, Disp: 140 tablet, Rfl: 0  Social History   Tobacco Use  Smoking Status Never Smoker  Smokeless Tobacco Never Used    Allergies  Allergen Reactions  . Talwin [Pentazocine] Other (See Comments)   Objective:  There were no vitals filed for this visit. There is no height or weight on file to calculate BMI. Constitutional Well developed. Well nourished.  Vascular Dorsalis pedis pulses palpable bilaterally. Posterior tibial pulses palpable bilaterally. Capillary refill normal to all digits.  No cyanosis or clubbing noted. Pedal hair growth normal.  Neurologic Normal speech. Oriented to person, place, and time. Epicritic sensation to light touch grossly present bilaterally.  Dermatologic Nails well groomed and normal in appearance. No open wounds. No skin lesions.  Orthopedic: Normal joint ROM without pain or crepitus bilaterally. Haglund deformity right posterior  calcaneus.  Gastrosoleus equinus noted Pain palpation about the posterior calcaneus and at the Achilles tendon.   Radiographs: None today Assessment:   1. Tendonitis, Achilles, right   2. Retrocalcaneal bone spur   3. Synovitis and tenosynovitis   4. Haglund's deformity of left heel   5. Equinus deformity of foot    Plan:  Patient was evaluated and treated and all questions answered.  Right Haglund's deformity -Patient has failed conservative therapy including stretching night splint immobilization and and have amatory gel.  He would like to consider surgery.  Advised physical therapy prior to surgery however patient declined. -Discussed surgical correction of the above deformity.  Would benefit from concomitant gastroc lengthening.  Rationale for procedures explained to patient.  Discussed need for 6 weeks of nonweightbearing.  He will need to stop his Coumadin just prior to surgery but then to continue it after surgery as he has a high risk for DVT and he will be nonweightbearing.  Patient verbalized that he already has a knee scooter and he can do the 6 weeks and not likely  -Advised he go to his PCP for preoperative clearance -Patient has failed all conservative therapy and wishes to proceed with surgical intervention. All risks, benefits, and alternatives discussed with patient. No guarantees given. Consent reviewed and signed by patient. -Planned procedures: Right Haglund's resection with detachment and reattachment of the Achilles tendon; gastrocnemius recession.  Tentative date 05/31/2018.  No follow-ups on file.

## 2018-03-21 NOTE — Patient Instructions (Signed)
Pre-Operative Instructions  Congratulations, you have decided to take an important step towards improving your quality of life.  You can be assured that the doctors and staff at [facility] will be with you every step of the way.  Here are some important things you should know:  1. Plan to be at the surgery center/hospital at least 1 (one) hour prior to your scheduled time, unless otherwise directed by the surgical center/hospital staff.  You must have a responsible adult accompany you, remain during the surgery and drive you home.  Make sure you have directions to the surgical center/hospital to ensure you arrive on time. 2. If you are having surgery at [facility] or [facility] hospitals, you will need a copy of your medical history and physical form from your family physician within one month prior to the date of surgery. We will give you a form for your primary physician to complete.  3. We make every effort to accommodate the date you request for surgery.  However, there are times where surgery dates or times have to be moved.  We will contact you as soon as possible if a change in schedule is required.   4. No aspirin/ibuprofen for one week before surgery.  If you are on aspirin, any non-steroidal anti-inflammatory medications (Mobic, Aleve, Ibuprofen) should not be taken seven (7) days prior to your surgery.  You make take Tylenol for pain prior to surgery.  5. Medications - If you are taking daily heart and blood pressure medications, seizure, reflux, allergy, asthma, anxiety, pain or diabetes medications, make sure you notify the surgery center/hospital before the day of surgery so they can tell you which medications you should take or avoid the day of surgery. 6. No food or drink after midnight the night before surgery unless directed otherwise by surgical center/hospital staff. 7. No alcoholic beverages 24-hours prior to surgery.  No smoking 24-hours prior or 24-hours after  surgery. 8. Wear loose pants or shorts. They should be loose enough to fit over bandages, boots, and casts. 9. Don't wear slip-on shoes. Sneakers are preferred. 10. Bring your boot with you to the surgery center/hospital.  Also bring crutches or a walker if your physician has prescribed it for you.  If you do not have this equipment, it will be provided for you after surgery. 11. If you have not been contacted by the surgery center/hospital by the day before your surgery, call to confirm the date and time of your surgery. 12. Leave-time from work may vary depending on the type of surgery you have.  Appropriate arrangements should be made prior to surgery with your employer. 13. Prescriptions will be provided immediately following surgery by your doctor.  Fill these as soon as possible after surgery and take the medication as directed. Pain medications will not be refilled on weekends and must be approved by the doctor. 14. Remove nail polish on the operative foot and avoid getting pedicures prior to surgery. 15. Wash the night before surgery.  The night before surgery wash the foot and leg well with water and the antibacterial soap provided. Be sure to pay special attention to beneath the toenails and in between the toes.  Wash for at least three (3) minutes. Rinse thoroughly with water and dry well with a towel.  Perform this wash unless told not to do so by your physician.  Enclosed: 1 Ice pack (please put in freezer the night before surgery)   1 Hibiclens skin cleaner     Pre-op instructions  If you have any questions regarding the instructions, please do not hesitate to call our office.  [place]: [address], [place], [place] [address] -- [phone]  [place]: [address] [place], [place] [address] -- [phone]  [place]: [address]  [place], [place] [address] -- [phone]  [place]: [address], [address], [place], [place] [address] -- [phone]  Website:  [email or web address] 

## 2018-03-22 ENCOUNTER — Telehealth: Payer: Self-pay | Admitting: *Deleted

## 2018-03-22 NOTE — Telephone Encounter (Signed)
"  I'm calling for my husband, Antonio Davis, to schedule his surgery."  Did he say where he was having the surgery, [place] location or [facility]?  "It's going to be done in [place]."  Do you have a date in mind?  "Yes, he wants to do it November 20."  November 20 is available.  I will get it scheduled.  He just needs to go online and register with the surgical center via [facility] [employer], instructions are in the brochure that they gave you.

## 2018-03-28 ENCOUNTER — Ambulatory Visit: Payer: 59 | Admitting: Podiatry

## 2018-04-06 ENCOUNTER — Telehealth: Payer: Self-pay | Admitting: *Deleted

## 2018-04-06 MED ORDER — OXYCODONE HCL 5 MG PO TABS: 2.5000 mg | ORAL_TABLET | Freq: Every evening | ORAL | 0 refills | Status: DC | PRN

## 2018-04-06 NOTE — Telephone Encounter (Signed)
Rx for low dose oxycodone for pain to be taken at night. Please inform patient.

## 2018-04-06 NOTE — Telephone Encounter (Signed)
Patients wife called for him wondering if there is anything he can have for pain or sleep?  He hurt his heel last week and is having to continue to work on it.  Even if he can only take it at night it would be something, he has not slept a full night in over a week.  He is just trying to hang on until surgery in November. Please call me.

## 2018-05-02 ENCOUNTER — Encounter: Payer: Self-pay | Admitting: Physician Assistant

## 2018-05-02 ENCOUNTER — Ambulatory Visit (INDEPENDENT_AMBULATORY_CARE_PROVIDER_SITE_OTHER): Payer: 59 | Admitting: Physician Assistant

## 2018-05-02 VITALS — BP 130/80 | HR 74 | Temp 98.6°F | Resp 20 | Ht 73.0 in | Wt 324.0 lb

## 2018-05-02 DIAGNOSIS — Z13220 Encounter for screening for lipoid disorders: Secondary | ICD-10-CM

## 2018-05-02 DIAGNOSIS — Z114 Encounter for screening for human immunodeficiency virus [HIV]: Secondary | ICD-10-CM

## 2018-05-02 DIAGNOSIS — Z130 Encounter for screening for diseases of the blood and blood-forming organs and certain disorders involving the immune mechanism: Secondary | ICD-10-CM

## 2018-05-02 DIAGNOSIS — Z131 Encounter for screening for diabetes mellitus: Secondary | ICD-10-CM

## 2018-05-02 DIAGNOSIS — Z23 Encounter for immunization: Secondary | ICD-10-CM | POA: Diagnosis not present

## 2018-05-02 DIAGNOSIS — I82499 Acute embolism and thrombosis of other specified deep vein of unspecified lower extremity: Secondary | ICD-10-CM

## 2018-05-02 DIAGNOSIS — Z1329 Encounter for screening for other suspected endocrine disorder: Secondary | ICD-10-CM

## 2018-05-02 LAB — POCT INR
INR: 2.7 (ref 2.0–3.0)
PT: 31.9

## 2018-05-02 NOTE — Progress Notes (Signed)
Patient: Antonio Davis, Male    DOB: December 31, 1975, 42 y.o.   MRN: [id number] Visit Date: 05/10/2018  Today's Provider: Trinna Post, PA-C   Chief Complaint  Patient presents with  . New Patient (Initial Visit)   Subjective:    Annual physical exam Antonio Davis is a 42 y.o. male who presents today to establish care. He lives in [place], [place]. Previous patient of Dr. Rex Kras. He works as a Development worker, community.   History of DVT: He reports history of a DVT. He has never completed a hypercoagulable workup. He says his father has had multiple DVTs and his brother has as well. He says his brother went through the hypercoagulable work up and did not find anything but after stopping warfarin, his brother had five blood clots. Patient reports taking warfarin 10 mg M,W,F and Sat. And 5 mg Sun.,Tues, and Thurs. Has seen cardiology for management of anticoagulation, goal INR 2-3.  Right Haglund Deformity: Patient reports that he will have surgery on his right ankle on 06/02/18 with Dr. March Rummage for right Haglund Deformity  ----------------------------------------------------------------   Review of Systems  Constitutional: Negative.   HENT: Negative.   Eyes: Negative.   Respiratory: Negative.   Cardiovascular: Positive for leg swelling.  Gastrointestinal: Negative.   Endocrine: Negative.   Genitourinary: Negative.   Musculoskeletal: Negative.   Skin: Negative.   Allergic/Immunologic: Negative.   Neurological: Negative.   Hematological: Bruises/bleeds easily.  Psychiatric/Behavioral: Negative.     Social History      He  reports that he has never smoked. He has never used smokeless tobacco. He reports that he does not drink alcohol or use drugs.       Social History   Socioeconomic History  . Marital status: Married    Spouse name: Not on file  . Number of children: Not on file  . Years of education: Not on file  . Highest education level: Not on file  Occupational History  . Not on  file  Social Needs  . Financial resource strain: Not on file  . Food insecurity:    Worry: Not on file    Inability: Not on file  . Transportation needs:    Medical: Not on file    Non-medical: Not on file  Tobacco Use  . Smoking status: Never Smoker  . Smokeless tobacco: Never Used  Substance and Sexual Activity  . Alcohol use: No  . Drug use: No  . Sexual activity: Not on file  Lifestyle  . Physical activity:    Days per week: Not on file    Minutes per session: Not on file  . Stress: Not on file  Relationships  . Social connections:    Talks on phone: Not on file    Gets together: Not on file    Attends religious service: Not on file    Active member of club or organization: Not on file    Attends meetings of clubs or organizations: Not on file    Relationship status: Not on file  Other Topics Concern  . Not on file  Social History Narrative   Works for [employer], maintenance   Married.  In process of adopting child.    Past Medical History:  Diagnosis Date  . History of DVT (deep vein thrombosis)    R calf; 2010; on Coumadin since  . IBS (irritable bowel syndrome)      Patient Active Problem List   Diagnosis Date  Noted  . Encounter for therapeutic drug monitoring 04/12/2017  . DVT (deep venous thrombosis) ([place]) 04/12/2017  . Hypercoagulable state ([place]) 04/12/2017  . History of blood clots 03/15/2017  . IBS (irritable bowel syndrome) 03/15/2017  . History of Coumadin therapy 03/15/2017  . Screening cholesterol level 03/15/2017  . Screening for diabetes mellitus 03/15/2017  . Healthcare maintenance 03/15/2017    Past Surgical History:  Procedure Laterality Date  . ANTERIOR CRUCIATE LIGAMENT REPAIR Bilateral   . KNEE ARTHROSCOPY W/ MENISCAL REPAIR    . SHOULDER SURGERY     2/2 fracture  . VASECTOMY      Family History        Family Status  Relation Name Status  . Mother  Deceased  . Father  Alive  . Brother  (Not Specified)         His family history includes Clotting disorder in his brother and father.      Allergies  Allergen Reactions  . Talwin [Pentazocine] Other (See Comments)     Current Outpatient Medications:  .  diclofenac sodium (VOLTAREN) 1 % GEL, Apply 2 g topically 4 (four) times daily., Disp: 100 g, Rfl: 1 .  warfarin (COUMADIN) 5 MG tablet, 10 mg MWF, sat and 5 mg Tue, Thurs and Sun, Disp: 140 tablet, Rfl: 0   Patient Care Team: Paulene Floor as PCP - General (Physician Assistant)      Objective:   Vitals: BP 130/80 (BP Location: Left Arm, Patient Position: Sitting, Cuff Size: Large)   Pulse 74   Temp 98.6 F (37 C) (Oral)   Resp 20   Ht 6\' 1"  (1.854 m)   Wt (!) 324 lb (147 kg)   SpO2 97%   BMI 42.75 kg/m    Vitals:   05/02/18 1500  BP: 130/80  Pulse: 74  Resp: 20  Temp: 98.6 F (37 C)  TempSrc: Oral  SpO2: 97%  Weight: (!) 324 lb (147 kg)  Height: 6\' 1"  (1.854 m)     Physical Exam  Constitutional: He is oriented to person, place, and time. He appears well-developed and well-nourished.  HENT:  Right Ear: External ear normal.  Left Ear: External ear normal.  Nose: Nose normal.  Mouth/Throat: Oropharynx is clear and moist. No oropharyngeal exudate.  Cardiovascular: Normal rate and regular rhythm.  Pulmonary/Chest: Effort normal and breath sounds normal.  Abdominal: Soft. Bowel sounds are normal.  Neurological: He is alert and oriented to person, place, and time.  Skin: Skin is warm and dry.  Psychiatric: He has a normal mood and affect. His behavior is normal.     Depression Screen PHQ 2/9 Scores 05/02/2018 03/15/2017  PHQ - 2 Score 0 0  PHQ- 9 Score 0 -      Assessment & Plan:     Routine Health Maintenance and Physical Exam  Exercise Activities and Dietary recommendations Goals   None     Immunization History  Administered Date(s) Administered  . Influenza,inj,Quad PF,6+ Mos 05/02/2018  . Tdap 05/02/2018    Health Maintenance  Topic Date  Due  . Samul Dada  05/02/2028  . INFLUENZA VACCINE  Completed  . HIV Screening  Completed     Discussed health benefits of physical activity, and encouraged him to engage in regular exercise appropriate for his age and condition.   .1. Deep vein thrombosis (DVT) of other vein of lower extremity, unspecified chronicity, unspecified laterality (HCC)  INR 2.7 today. Continue current dose warfarin. See back in one month  for this in PT/INR clinic. May need pre-op clearance.  - POCT INR  2. Encounter for screening for HIV  - HIV antibody (with reflex)  3. Screening for deficiency anemia  - CBC with Differential  4. Diabetes mellitus screening  - Comprehensive Metabolic Panel (CMET)  5. Thyroid disorder screening  - TSH  6. Screening cholesterol level  - Lipid Profile  7. Need for Tdap vaccination  - Tdap vaccine greater than or equal to 7yo IM  8. Need for influenza vaccination  - Flu Vaccine QUAD 36+ mos IM  Return in about 6 months (around 11/01/2018) for warfarin.  The entirety of the information documented in the History of Present Illness, Review of Systems and Physical Exam were personally obtained by me. Portions of this information were initially documented by Lynford Humphrey, CMA and reviewed by me for thoroughness and accuracy.   --------------------------------------------------------------------    Trinna Post, PA-C  [facility]

## 2018-05-02 NOTE — Patient Instructions (Signed)
Deep Vein Thrombosis Deep vein thrombosis (DVT) is a condition in which a blood clot forms in a deep vein, such as a lower leg, thigh, or arm vein. A clot is blood that has thickened into a gel or solid. This condition is dangerous. It can lead to serious and even life-threatening complications if the clot travels to the lungs and causes a blockage (pulmonary embolism). It can also damage veins in the leg. This can result in leg pain, swelling, discoloration, and sores (post-thrombotic syndrome). What are the causes? This condition may be caused by:  A slowdown of blood flow.  Damage to a vein.  A condition that makes blood clot more easily.  What increases the risk? The following factors may make you more likely to develop this condition:  Being overweight.  Being elderly, especially over age 60.  Sitting or lying down for more than four hours.  Lack of physical activity (sedentary lifestyle).  Being pregnant, giving birth, or having recently given birth.  Taking medicines that contain estrogen.  Smoking.  A history of any of the following: ? Blood clots or blood clotting disease. ? Peripheral vascular disease. ? Inflammatory bowel disease. ? Cancer. ? Heart disease. ? Genetic conditions that affect how blood clots. ? Neurological diseases that affect the legs (leg paresis). ? Injury. ? Major or lengthy surgery. ? A central line placed inside a large vein.  What are the signs or symptoms? Symptoms of this condition include:  Swelling, pain, or tenderness in an arm or leg.  Warmth, redness, or discoloration in an arm or leg.  If the clot is in your leg, symptoms may be more noticeable or worse when you stand or walk. Some people do not have any symptoms. How is this diagnosed? This condition is diagnosed with:  A medical history.  A physical exam.  Tests, such as: ? Blood tests. These are done to see how your blood clots. ? Imaging tests. These are done to  check for clots. Tests may include:  Ultrasound.  CT scan.  MRI.  X-ray.  Venogram. For this test, X-rays are taken after a dye is injected into a vein.  How is this treated? Treatment for this condition depends on the cause, your risk for bleeding or developing more clots, and any medical conditions you have. Treatment may include:  Taking blood thinners (also called anticoagulants). These medicines may be taken by mouth, injected under the skin, or injected through an IV tube (catheter). These medicines prevent clots from forming.  Injecting medicine that dissolves blood clots into the affected vein (catheter-directed thrombolysis).  Having surgery. Surgery may be done to: ? Remove the clot. ? Place a filter in a large vein to catch blood clots before they reach the lungs.  Some treatments may be continued for up to six months. Follow these instructions at home: If you are taking an oral blood thinner:  Take the medicine exactly as told by your health care provider. Some blood thinners need to be taken at the same time every day. Do not skip a dose.  Ask your health care provider about what foods and drugs interact with the medicine.  Ask about possible side effects. General instructions  Blood thinners can cause easy bruising and difficulty stopping bleeding. Because of this, if you are taking or were given a blood thinner: ? Hold pressure over cuts for longer than usual. ? Tell your dentist and other health care providers that you are taking blood thinners before   having any procedures that can cause bleeding. ? Avoid contact sports.  Take over-the-counter and prescription medicines only as told by your health care provider.  Return to your normal activities as told by your health care provider. Ask your health care provider what activities are safe for you.  Wear compression stockings if recommended by your health care provider.  Keep all follow-up visits as told by  your health care provider. This is important. How is this prevented? To lower your risk of developing this condition again:  For 30 or more minutes every day, do an activity that: ? Involves moving your arms and legs. ? Increases your heart rate.  When traveling for longer than four hours: ? Exercise your arms and legs every hour. ? Drink plenty of water. ? Avoid drinking alcohol.  Avoid sitting or lying for a long time without moving your legs.  Stay a healthy weight.  If you are a woman who is older than age 35, avoid unnecessary use of medicines that contain estrogen.  Do not use any products that contain nicotine or tobacco, such as cigarettes and e-cigarettes. This is especially important if you take estrogen medicines. If you need help quitting, ask your health care provider.  Contact a health care provider if:  You miss a dose of your blood thinner.  You have nausea, vomiting, or diarrhea that lasts for more than one day.  Your menstrual period is heavier than usual.  You have unusual bruising. Get help right away if:  You have new or increased pain, swelling, or redness in an arm or leg.  You have numbness or tingling in an arm or leg.  You have shortness of breath.  You have chest pain.  You have a rapid or irregular heartbeat.  You feel light-headed or dizzy.  You cough up blood.  There is blood in your vomit, stool, or urine.  You have a serious fall or accident, or you hit your head.  You have a severe headache or confusion.  You have a cut that will not stop bleeding. These symptoms may represent a serious problem that is an emergency. Do not wait to see if the symptoms will go away. Get medical help right away. Call your local emergency services (911 in the [place] Do not drive yourself to the hospital. Summary  DVT is a condition in which a blood clot forms in a deep vein, such as a lower leg, thigh, or arm vein.  Symptoms can include swelling,  warmth, pain, and redness in your leg or arm.  Treatment may include taking blood thinners, injecting medicine that dissolves blood clots,wearing compression stockings, or surgery.  If you are prescribed blood thinners, take them exactly as told. This information is not intended to replace advice given to you by your health care provider. Make sure you discuss any questions you have with your health care provider. Document Released: 06/28/2005 Document Revised: 07/31/2016 Document Reviewed: 07/31/2016 Elsevier Interactive Patient Education  2018 [employer]  

## 2018-05-03 LAB — TSH: TSH: 1.11 u[IU]/mL (ref 0.450–4.500)

## 2018-05-03 LAB — CBC WITH DIFFERENTIAL/PLATELET
Basophils Absolute: 0 10*3/uL (ref 0.0–0.2)
Basos: 0 %
EOS (ABSOLUTE): 0.1 10*3/uL (ref 0.0–0.4)
Eos: 1 %
Hematocrit: 46.1 % (ref 37.5–51.0)
Hemoglobin: 15.5 g/dL (ref 13.0–17.7)
Immature Grans (Abs): 0.1 10*3/uL (ref 0.0–0.1)
Immature Granulocytes: 1 %
Lymphocytes Absolute: 2.2 10*3/uL (ref 0.7–3.1)
Lymphs: 25 %
MCH: 29.9 pg (ref 26.6–33.0)
MCHC: 33.6 g/dL (ref 31.5–35.7)
MCV: 89 fL (ref 79–97)
Monocytes Absolute: 1 10*3/uL — ABNORMAL HIGH (ref 0.1–0.9)
Monocytes: 11 %
Neutrophils Absolute: 5.3 10*3/uL (ref 1.4–7.0)
Neutrophils: 62 %
Platelets: 231 10*3/uL (ref 150–450)
RBC: 5.18 x10E6/uL (ref 4.14–5.80)
RDW: 12.6 % (ref 12.3–15.4)
WBC: 8.6 10*3/uL (ref 3.4–10.8)

## 2018-05-03 LAB — COMPREHENSIVE METABOLIC PANEL
ALT: 66 IU/L — ABNORMAL HIGH (ref 0–44)
AST: 48 IU/L — ABNORMAL HIGH (ref 0–40)
Albumin/Globulin Ratio: 1.8 (ref 1.2–2.2)
Albumin: 4.5 g/dL (ref 3.5–5.5)
Alkaline Phosphatase: 83 IU/L (ref 39–117)
BUN/Creatinine Ratio: 17 (ref 9–20)
BUN: 20 mg/dL (ref 6–24)
Bilirubin Total: 0.3 mg/dL (ref 0.0–1.2)
CO2: 22 mmol/L (ref 20–29)
Calcium: 9.2 mg/dL (ref 8.7–10.2)
Chloride: 107 mmol/L — ABNORMAL HIGH (ref 96–106)
Creatinine, Ser: 1.15 mg/dL (ref 0.76–1.27)
GFR calc Af Amer: 89 mL/min/{1.73_m2} (ref >59)
GFR calc non Af Amer: 77 mL/min/{1.73_m2} (ref >59)
Globulin, Total: 2.5 g/dL (ref 1.5–4.5)
Glucose: 88 mg/dL (ref 65–99)
Potassium: 4.3 mmol/L (ref 3.5–5.2)
Sodium: 145 mmol/L — ABNORMAL HIGH (ref 134–144)
Total Protein: 7 g/dL (ref 6.0–8.5)

## 2018-05-03 LAB — LIPID PANEL
Chol/HDL Ratio: 2.7 ratio (ref 0.0–5.0)
Cholesterol, Total: 119 mg/dL (ref 100–199)
HDL: 44 mg/dL (ref >39)
LDL Calculated: 52 mg/dL (ref 0–99)
Triglycerides: 114 mg/dL (ref 0–149)
VLDL Cholesterol Cal: 23 mg/dL (ref 5–40)

## 2018-05-03 LAB — HIV ANTIBODY (ROUTINE TESTING W REFLEX): HIV Screen 4th Generation wRfx: NONREACTIVE

## 2018-05-05 ENCOUNTER — Ambulatory Visit: Payer: 59 | Admitting: Podiatry

## 2018-05-18 ENCOUNTER — Telehealth: Payer: Self-pay | Admitting: *Deleted

## 2018-05-18 NOTE — Telephone Encounter (Signed)
Patients wife called stating that they got a message from [employer] regarding the surgery on 11/20 and it is stating the wrong foot it should be the Right not the left.  After checking with Delydia our surgical coordinator the wrong foot was placed on the consent.  Dr. March Rummage will have a corrected consent at surgery.  Patients wife stated gratitude

## 2018-05-30 ENCOUNTER — Ambulatory Visit: Payer: Self-pay

## 2018-05-31 ENCOUNTER — Other Ambulatory Visit: Payer: Self-pay | Admitting: Podiatry

## 2018-05-31 DIAGNOSIS — M9261 Juvenile osteochondrosis of tarsus, right ankle: Secondary | ICD-10-CM

## 2018-05-31 DIAGNOSIS — M7661 Achilles tendinitis, right leg: Secondary | ICD-10-CM | POA: Diagnosis not present

## 2018-05-31 DIAGNOSIS — M7730 Calcaneal spur, unspecified foot: Secondary | ICD-10-CM | POA: Diagnosis not present

## 2018-05-31 DIAGNOSIS — M216X1 Other acquired deformities of right foot: Secondary | ICD-10-CM | POA: Diagnosis not present

## 2018-05-31 DIAGNOSIS — M7731 Calcaneal spur, right foot: Secondary | ICD-10-CM

## 2018-05-31 DIAGNOSIS — M65871 Other synovitis and tenosynovitis, right ankle and foot: Secondary | ICD-10-CM

## 2018-05-31 MED ORDER — CEPHALEXIN 500 MG PO CAPS: 500.0000 mg | ORAL_CAPSULE | Freq: Three times a day (TID) | ORAL | 0 refills | Status: DC

## 2018-05-31 MED ORDER — OXYCODONE-ACETAMINOPHEN 10-325 MG PO TABS: 1.0000 | ORAL_TABLET | ORAL | 0 refills | Status: DC | PRN

## 2018-05-31 MED ORDER — ONDANSETRON HCL 4 MG PO TABS: 4.0000 mg | ORAL_TABLET | Freq: Three times a day (TID) | ORAL | 0 refills | Status: DC | PRN

## 2018-05-31 NOTE — Progress Notes (Signed)
Patient underwent outpatient surgery at [facility].  DOS: 05/31/18  Procedure: Right Gastrocnemius Recession, Haglund's Recession with Detachment/Reattachment of Achilles Tendon, Tenolysis, Manipulation under Anesthesia, and Splint Application

## 2018-05-31 NOTE — Progress Notes (Signed)
Rx sent to pharmacy for outpatient surgery. °

## 2018-06-05 ENCOUNTER — Telehealth: Payer: Self-pay | Admitting: *Deleted

## 2018-06-05 NOTE — Telephone Encounter (Signed)
We received a request for additional services to be authorized.  We do not do retro unless it's an inpatient surgery.  So, what we suggest is that you submit the claim with the additional procedures.  We will process it and then make a determination.  If it's not authorized, you can submit an appeal for the denied services."  I will let Dr. March Rummage know.

## 2018-06-05 NOTE — Telephone Encounter (Signed)
Thanks

## 2018-06-06 ENCOUNTER — Ambulatory Visit (INDEPENDENT_AMBULATORY_CARE_PROVIDER_SITE_OTHER): Payer: 59 | Admitting: Podiatry

## 2018-06-06 ENCOUNTER — Ambulatory Visit (INDEPENDENT_AMBULATORY_CARE_PROVIDER_SITE_OTHER): Payer: 59

## 2018-06-06 DIAGNOSIS — M7730 Calcaneal spur, unspecified foot: Secondary | ICD-10-CM

## 2018-06-06 DIAGNOSIS — M9261 Juvenile osteochondrosis of tarsus, right ankle: Secondary | ICD-10-CM

## 2018-06-06 DIAGNOSIS — M216X9 Other acquired deformities of unspecified foot: Secondary | ICD-10-CM | POA: Diagnosis not present

## 2018-06-06 MED ORDER — CEPHALEXIN 500 MG PO CAPS: 500.0000 mg | ORAL_CAPSULE | Freq: Three times a day (TID) | ORAL | 0 refills | Status: DC

## 2018-06-06 MED ORDER — OXYCODONE-ACETAMINOPHEN 10-325 MG PO TABS: 1.0000 | ORAL_TABLET | ORAL | 0 refills | Status: DC | PRN

## 2018-06-07 NOTE — Progress Notes (Signed)
  Subjective:  Patient ID: Antonio Davis, male    DOB: 1975-08-28,  MRN: [id number]  Chief Complaint  Patient presents with  . Routine Post Op    DOS: 11/20/196 Exc. of Hagulinds Def. R, Tenolysis R, and Gastrocnemius Recess R -Pt. states," the pain meds does their job real good, but w/- it it's painful." Tx: pain meds -w/ low grade fever -pt deneis N/V/F/CH     DOS: 05/31/18 Procedure: OGR, Haglund's Resection, Tenolysis Achilles Tendon  42 y.o. male returns for post-op check. History as above. Reports compliance with NWB status. Denies post-op issues.  Review of Systems: Negative except as noted in the HPI. Denies N/V/F/Ch.  Past Medical History:  Diagnosis Date  . History of DVT (deep vein thrombosis)    R calf; 2010; on Coumadin since  . IBS (irritable bowel syndrome)     Current Outpatient Medications:  .  cephALEXin (KEFLEX) 500 MG capsule, Take 1 capsule (500 mg total) by mouth 3 (three) times daily., Disp: 21 capsule, Rfl: 0 .  diclofenac sodium (VOLTAREN) 1 % GEL, Apply 2 g topically 4 (four) times daily., Disp: 100 g, Rfl: 1 .  ondansetron (ZOFRAN) 4 MG tablet, Take 1 tablet (4 mg total) by mouth every 8 (eight) hours as needed for nausea or vomiting., Disp: 20 tablet, Rfl: 0 .  oxyCODONE-acetaminophen (PERCOCET) 10-325 MG tablet, Take 1 tablet by mouth every 4 (four) hours as needed for pain., Disp: 20 tablet, Rfl: 0 .  warfarin (COUMADIN) 5 MG tablet, 10 mg MWF, sat and 5 mg Tue, Thurs and Sun, Disp: 140 tablet, Rfl: 0  Social History   Tobacco Use  Smoking Status Never Smoker  Smokeless Tobacco Never Used    Allergies  Allergen Reactions  . Talwin [Pentazocine] Other (See Comments)   Objective:  There were no vitals filed for this visit. There is no height or weight on file to calculate BMI. Constitutional Well developed. Well nourished.  Vascular Foot warm and well perfused. Capillary refill normal to all digits.   Neurologic Normal speech. Oriented to  person, place, and time. Epicritic sensation to light touch grossly present bilaterally.  Dermatologic Skin healing well without signs of infection. Skin edges well coapted without signs of infection.  Orthopedic: Tenderness to palpation noted about the surgical site.   Radiographs: Taken and reviewed c/w post-op state. Assessment:   1. Haglund's deformity of right heel   2. Retrocalcaneal bone spur   3. Equinus deformity of foot    Plan:  Patient was evaluated and treated and all questions answered.  S/p foot surgery right -Progressing as expected post-operatively. -XR: As above -WB Status: NWB in CAM Boot -Sutures: intact. -Medications: Refill percocet and keflex -Foot redressed.  No follow-ups on file.

## 2018-06-11 ENCOUNTER — Encounter: Payer: Self-pay | Admitting: Emergency Medicine

## 2018-06-11 ENCOUNTER — Emergency Department: Payer: 59

## 2018-06-11 ENCOUNTER — Emergency Department
Admission: EM | Admit: 2018-06-11 | Discharge: 2018-06-11 | Disposition: A | Discharge status: Home / Self Care | Payer: 59 | Attending: Emergency Medicine | Admitting: Emergency Medicine

## 2018-06-11 DIAGNOSIS — Z86718 Personal history of other venous thrombosis and embolism: Secondary | ICD-10-CM | POA: Insufficient documentation

## 2018-06-11 DIAGNOSIS — Z7901 Long term (current) use of anticoagulants: Secondary | ICD-10-CM | POA: Diagnosis not present

## 2018-06-11 DIAGNOSIS — M79604 Pain in right leg: Secondary | ICD-10-CM | POA: Diagnosis present

## 2018-06-11 DIAGNOSIS — Z79899 Other long term (current) drug therapy: Secondary | ICD-10-CM | POA: Insufficient documentation

## 2018-06-11 DIAGNOSIS — I8001 Phlebitis and thrombophlebitis of superficial vessels of right lower extremity: Secondary | ICD-10-CM | POA: Diagnosis not present

## 2018-06-11 LAB — COMPREHENSIVE METABOLIC PANEL
ALBUMIN: 4 g/dL (ref 3.5–5.0)
ALT: 56 U/L — AB (ref 0–44)
ANION GAP: 9 (ref 5–15)
AST: 36 U/L (ref 15–41)
Alkaline Phosphatase: 64 U/L (ref 38–126)
BUN: 17 mg/dL (ref 6–20)
CHLORIDE: 107 mmol/L (ref 98–111)
CO2: 25 mmol/L (ref 22–32)
Calcium: 8.9 mg/dL (ref 8.9–10.3)
Creatinine, Ser: 1.35 mg/dL — ABNORMAL HIGH (ref 0.61–1.24)
GFR calc non Af Amer: >60 mL/min (ref >60)
GLUCOSE: 115 mg/dL — AB (ref 70–99)
Potassium: 4.1 mmol/L (ref 3.5–5.1)
SODIUM: 141 mmol/L (ref 135–145)
Total Bilirubin: 0.7 mg/dL (ref 0.3–1.2)
Total Protein: 7.5 g/dL (ref 6.5–8.1)

## 2018-06-11 LAB — CBC WITH DIFFERENTIAL/PLATELET
ABS IMMATURE GRANULOCYTES: 0.04 10*3/uL (ref 0.00–0.07)
BASOS ABS: 0 10*3/uL (ref 0.0–0.1)
Basophils Relative: 0 %
Eosinophils Absolute: 0.1 10*3/uL (ref 0.0–0.5)
Eosinophils Relative: 1 %
HEMATOCRIT: 45.5 % (ref 39.0–52.0)
HEMOGLOBIN: 15.1 g/dL (ref 13.0–17.0)
Immature Granulocytes: 0 %
LYMPHS ABS: 2.1 10*3/uL (ref 0.7–4.0)
LYMPHS PCT: 21 %
MCH: 29.5 pg (ref 26.0–34.0)
MCHC: 33.2 g/dL (ref 30.0–36.0)
MCV: 89 fL (ref 80.0–100.0)
MONOS PCT: 10 %
Monocytes Absolute: 1 10*3/uL (ref 0.1–1.0)
NRBC: 0 % (ref 0.0–0.2)
Neutro Abs: 7 10*3/uL (ref 1.7–7.7)
Neutrophils Relative %: 68 %
Platelets: 309 10*3/uL (ref 150–400)
RBC: 5.11 MIL/uL (ref 4.22–5.81)
RDW: 12.2 % (ref 11.5–15.5)
WBC: 10.2 10*3/uL (ref 4.0–10.5)

## 2018-06-11 LAB — PROTIME-INR
INR: 1.64
Prothrombin Time: 19.2 seconds — ABNORMAL HIGH (ref 11.4–15.2)

## 2018-06-11 NOTE — ED Notes (Signed)
Pt in Ultrasound

## 2018-06-11 NOTE — ED Provider Notes (Signed)
[facility] [address] Emergency Department Provider Note   ____________________________________________   First MD Initiated Contact with Patient 06/11/18 2311     (approximate)  I have reviewed the triage vital signs and the nursing notes.   HISTORY  Chief Complaint Leg Pain    HPI Antonio Davis is a 42 y.o. male who presents to the ED from home with a chief complaint of right leg swelling, redness and pain.  Patient had right Achilles heel surgery on November 20.  States he has had some pain and redness to his inner right lower thigh since that time.  Redness became noticeable 3 days ago and it is warm to the touch.  Patient has been taking Coumadin for the past 10 years for right DVT.  He was off of Coumadin for 14 days in preparation for the surgery and resumed taking it 5 days ago.  He takes an alternating 5 mg and 10 mg dosage.  Denies associated fever, chills, chest pain, shortness of breath, abdominal pain, nausea or vomiting.  Denies recent travel or trauma.   Past Medical History:  Diagnosis Date  . History of DVT (deep vein thrombosis)    R calf; 2010; on Coumadin since  . IBS (irritable bowel syndrome)     Patient Active Problem List   Diagnosis Date Noted  . Encounter for therapeutic drug monitoring 04/12/2017  . DVT (deep venous thrombosis) ([place]) 04/12/2017  . Hypercoagulable state ([place]) 04/12/2017  . History of blood clots 03/15/2017  . IBS (irritable bowel syndrome) 03/15/2017  . History of Coumadin therapy 03/15/2017  . Screening cholesterol level 03/15/2017  . Screening for diabetes mellitus 03/15/2017  . Healthcare maintenance 03/15/2017    Past Surgical History:  Procedure Laterality Date  . ANKLE SURGERY    . ANTERIOR CRUCIATE LIGAMENT REPAIR Bilateral   . KNEE ARTHROSCOPY W/ MENISCAL REPAIR    . SHOULDER SURGERY     2/2 fracture  . VASECTOMY      Prior to Admission medications   Medication Sig Start Date End Date Taking?  Authorizing Provider  cephALEXin (KEFLEX) 500 MG capsule Take 1 capsule (500 mg total) by mouth 3 (three) times daily. 06/06/18   Evelina Bucy, DPM  diclofenac sodium (VOLTAREN) 1 % GEL Apply 2 g topically 4 (four) times daily. 02/06/18   Evelina Bucy, DPM  ondansetron (ZOFRAN) 4 MG tablet Take 1 tablet (4 mg total) by mouth every 8 (eight) hours as needed for nausea or vomiting. 05/31/18   Evelina Bucy, DPM  oxyCODONE-acetaminophen (PERCOCET) 10-325 MG tablet Take 1 tablet by mouth every 4 (four) hours as needed for pain. 06/06/18   Evelina Bucy, DPM  warfarin (COUMADIN) 5 MG tablet 10 mg MWF, sat and 5 mg Tue, Thurs and Sun 04/21/17   Esaw Grandchild, NP    Allergies Talwin [pentazocine]  Family History  Problem Relation Age of Onset  . Clotting disorder Father   . Clotting disorder Brother     Social History Social History   Tobacco Use  . Smoking status: Never Smoker  . Smokeless tobacco: Never Used  Substance Use Topics  . Alcohol use: No  . Drug use: No    Review of Systems  Constitutional: No fever/chills Eyes: No visual changes. ENT: No sore throat. Cardiovascular: Denies chest pain. Respiratory: Denies shortness of breath. Gastrointestinal: No abdominal pain.  No nausea, no vomiting.  No diarrhea.  No constipation. Genitourinary: Negative for dysuria. Musculoskeletal: Positive for right  leg pain and swelling.  Negative for back pain. Skin: Negative for rash. Neurological: Negative for headaches, focal weakness or numbness.   ____________________________________________   PHYSICAL EXAM:  VITAL SIGNS: ED Triage Vitals  Enc Vitals Group     BP 06/11/18 2020 131/77     Pulse Rate 06/11/18 2020 80     Resp 06/11/18 2020 18     Temp 06/11/18 2020 98 F (36.7 C)     Temp Source 06/11/18 2020 Oral     SpO2 06/11/18 2020 99 %     Weight 06/11/18 2021 (!) 316 lb (143.3 kg)     Height 06/11/18 2021 6' (1.829 m)     Head Circumference --       Peak Flow --      Pain Score 06/11/18 2021 5     Pain Loc --      Pain Edu? --      Excl. in [place]? --     Constitutional: Alert and oriented. Well appearing and in no acute distress. Eyes: Conjunctivae are normal. PERRL. EOMI. Head: Atraumatic. Nose: No congestion/rhinnorhea. Mouth/Throat: Mucous membranes are moist.  Oropharynx non-erythematous. Neck: No stridor.   Cardiovascular: Normal rate, regular rhythm. Grossly normal heart sounds.  Good peripheral circulation. Respiratory: Normal respiratory effort.  No retractions. Lungs CTAB. Gastrointestinal: Soft and nontender. No distention. No abdominal bruits. No CVA tenderness. Musculoskeletal:  RLE: Lower extremity in postop boot.  Redness and streaking to right lower inner thigh.  2+ femoral pulse.  Neurologic:  Normal speech and language. No gross focal neurologic deficits are appreciated.  Skin:  Skin is warm, dry and intact. No rash noted. Psychiatric: Mood and affect are normal. Speech and behavior are normal.  ____________________________________________   LABS (all labs ordered are listed, but only abnormal results are displayed)  Labs Reviewed  COMPREHENSIVE METABOLIC PANEL - Abnormal; Notable for the following components:      Result Value   Glucose, Bld 115 (*)    Creatinine, Ser 1.35 (*)    ALT 56 (*)    All other components within normal limits  PROTIME-INR - Abnormal; Notable for the following components:   Prothrombin Time 19.2 (*)    All other components within normal limits  CBC WITH DIFFERENTIAL/PLATELET   ____________________________________________  EKG  None ____________________________________________  RADIOLOGY  ED MD interpretation: No acute abnormality of right ankle; superficial thrombophlebitis of right greater saphenous vein from upper thigh to upper calf; no DVT  Official radiology report(s): Dg Ankle Complete Right  Result Date: 06/11/2018 CLINICAL DATA:  Achilles surgery May 31, 2018. Swelling and redness and right leg. EXAM: RIGHT ANKLE - COMPLETE 3+ VIEW COMPARISON:  None. FINDINGS: Posterior skin staples identified. No soft tissue gas noted. A screw seen in the posterior calcaneus, likely from recent surgery. No bony erosion. No fracture. IMPRESSION: No soft tissue gas or bony erosion identified. Electronically Signed   By: Dorise Bullion III M.D   On: 06/11/2018 21:23   US Venous Img Lower Unilateral Right  Result Date: 06/11/2018 CLINICAL DATA:  Right leg swelling.  Recent Achilles surgery EXAM: RIGHT LOWER EXTREMITY VENOUS DOPPLER ULTRASOUND TECHNIQUE: Gray-scale sonography with graded compression, as well as color Doppler and duplex ultrasound were performed to evaluate the lower extremity deep venous systems from the level of the common femoral vein and including the common femoral, femoral, profunda femoral, popliteal and calf veins including the posterior tibial, peroneal and gastrocnemius veins when visible. The superficial great saphenous vein was also  interrogated. Spectral Doppler was utilized to evaluate flow at rest and with distal augmentation maneuvers in the common femoral, femoral and popliteal veins. COMPARISON:  None. FINDINGS: Contralateral Common Femoral Vein: Respiratory phasicity is normal and symmetric with the symptomatic side. No evidence of thrombus. Normal compressibility. Common Femoral Vein: No evidence of thrombus. Normal compressibility, respiratory phasicity and response to augmentation. Saphenofemoral Junction: No evidence of thrombus. Normal compressibility and flow on color Doppler imaging. Profunda Femoral Vein: No evidence of thrombus. Normal compressibility and flow on color Doppler imaging. Femoral Vein: No evidence of thrombus. Normal compressibility, respiratory phasicity and response to augmentation. Popliteal Vein: No evidence of thrombus. Normal compressibility, respiratory phasicity and response to augmentation. Calf Veins: No evidence  of thrombus. Normal compressibility and flow on color Doppler imaging. Superficial Great Saphenous Vein: Thrombus noted from the upper thigh to the upper calf. Venous Reflux:  None. Other Findings:  None. IMPRESSION: No evidence of deep venous thrombosis. Superficial thrombophlebitis of the right greater saphenous vein from the upper thigh to the upper calf. Electronically Signed   By: Rolm Baptise M.D.   On: 06/11/2018 22:45    ____________________________________________   PROCEDURES  Procedure(s) performed: None  Procedures  Critical Care performed: No  ____________________________________________   INITIAL IMPRESSION / ASSESSMENT AND PLAN / ED COURSE  As part of my medical decision making, I reviewed the following data within the [place] History obtained from family, Nursing notes reviewed and incorporated, Labs reviewed, Radiograph reviewed and Notes from prior ED visits   42 year old male on Coumadin for prior right lower leg DVT who presents with redness and streaking status post Achilles heel surgery on November 20.  Ultrasound demonstrates superficial thrombophlebitis; no DVT.  We discussed options of increasing his Coumadin to 10 mg daily and to have it checked by his PCP and 3 to 5 days; also discussed option of bridging with Lovenox until Coumadin becomes therapeutic.  Patient very strongly declines Lovenox.  He will take Coumadin 10 mg daily starting tonight and contact his PCP in the morning for INR check in 3 to 5 days.  I did also suggest that he may safely take ibuprofen for just a few days which will help thrombophlebitis without overly thinning his blood.  Strict return precautions given.  Patient and spouse verbalize understanding and agree with plan of care.      ____________________________________________   FINAL CLINICAL IMPRESSION(S) / ED DIAGNOSES  Final diagnoses:  Thrombophlebitis of superficial veins of right lower extremity  Right  leg pain     ED Discharge Orders    None       Note:  This document was prepared using Dragon voice recognition software and may include unintentional dictation errors.    Paulette Blanch, MD 06/12/18 [phone]

## 2018-06-11 NOTE — ED Triage Notes (Signed)
Patient states that he had surgery on his right ankle on Nov 20th. Patient states that on Thursday he noticed swelling, redness and pain to his right leg. Patient states that today the redness and swelling has become worse. Patient with red streaks and warm to the touch from mid calf to his thigh. Patient states that he also has a history of DVT and that he was off of coumadin for 2 weeks for surgery.

## 2018-06-11 NOTE — Discharge Instructions (Signed)
1.  Increase your Coumadin dosage to 10mg  daily for the next 3 to 5 days until you see your doctor to have your level checked.  Do not exceed 10mg  for over 5 days.  Return to the ER to have your INR checked if you are unable to get in with your doctor. 2.  You may take Ibuprofen for the next 3 to 5 days for discomfort.  Do not take ibuprofen beyond the next 5 days as it may thin your blood too much since you are taking Coumadin. 3.  Apply warm compresses several times daily. 4.  Return to the ER for worsening symptoms, chest pain, difficulty breathing or other concerns.

## 2018-06-13 ENCOUNTER — Ambulatory Visit (INDEPENDENT_AMBULATORY_CARE_PROVIDER_SITE_OTHER): Payer: 59 | Admitting: Podiatry

## 2018-06-13 ENCOUNTER — Encounter: Payer: Self-pay | Admitting: Podiatry

## 2018-06-13 VITALS — BP 134/88 | HR 73 | Temp 97.9°F | Resp 16

## 2018-06-13 DIAGNOSIS — Z9889 Other specified postprocedural states: Secondary | ICD-10-CM

## 2018-06-13 DIAGNOSIS — I8001 Phlebitis and thrombophlebitis of superficial vessels of right lower extremity: Secondary | ICD-10-CM

## 2018-06-13 MED ORDER — HYDROMORPHONE HCL 2 MG PO TABS: 2.0000 mg | ORAL_TABLET | ORAL | 0 refills | Status: DC | PRN

## 2018-06-15 NOTE — Progress Notes (Signed)
Patient: Antonio Davis Male    DOB: 13-Apr-1976   42 y.o.   MRN: [id number] Visit Date: 06/16/2018  Today's Provider: Trinna Post, PA-C   Chief Complaint  Patient presents with  . Follow-up    ED and INR   Subjective:    HPI  Follow Up ER Visit  Patient is here for ER follow up. Patient has a history of DVT and is on chronic coumadin therapy. He underwent surgery for a right haglund's deformity with podiatry on 05/31/2018 with Dr. Hardie Pulley. He reports he stopped his warfarin one week prior and resumed it one week after surgery on 06/07/2018.  He was recently seen at [facility] for Thrombophebitis of superficial veins of right lower extremity on 06/11/18. Doppler ultrasound It was negative for a DVT but showed superficial thrombophlebitis of the right greater saphenous vein.  Treatment for this included increasing his warfarin to 10 mg daily as his INR on 06/11/2018 was subtherapeutic at 1.6. Alternative treatment was to bridge with Lovenox but patient declined. He reports excellent compliance with treatment. He reports this condition is Improved.Reports that redness is a little better and is receding from the top of his thigh. Reports the pain is still the same. He has percocet from his surgery which helps mildly. Says dilaudid did not help him.  INR today is 3.5. He is currently taking warfarin 10 mg daily.   In the interim he had been assessed by [facility] on 05/25/2018 for weight loss surgery. He has since decided not to proceed since his complications following this surgery. ------------------------------------------------------------------------------------    Allergies  Allergen Reactions  . Talwin [Pentazocine] Other (See Comments)     Current Outpatient Medications:  .  cephALEXin (KEFLEX) 500 MG capsule, Take 1 capsule (500 mg total) by mouth 3 (three) times daily., Disp: 21 capsule, Rfl: 0 .  diclofenac sodium (VOLTAREN) 1 % GEL, Apply 2 g  topically 4 (four) times daily., Disp: 100 g, Rfl: 1 .  HYDROmorphone (DILAUDID) 2 MG tablet, Take 1 tablet (2 mg total) by mouth every 4 (four) hours as needed for severe pain., Disp: 20 tablet, Rfl: 0 .  oxyCODONE-acetaminophen (PERCOCET) 10-325 MG tablet, Take 1 tablet by mouth every 4 (four) hours as needed for pain., Disp: 20 tablet, Rfl: 0 .  warfarin (COUMADIN) 5 MG tablet, 10 mg MWF, sat and 5 mg Tue, Thurs and Sun, Disp: 140 tablet, Rfl: 0 .  ondansetron (ZOFRAN) 4 MG tablet, Take 1 tablet (4 mg total) by mouth every 8 (eight) hours as needed for nausea or vomiting. (Patient not taking: Reported on 06/16/2018), Disp: 20 tablet, Rfl: 0  Review of Systems  Social History   Tobacco Use  . Smoking status: Never Smoker  . Smokeless tobacco: Never Used  Substance Use Topics  . Alcohol use: No   Objective:   BP 120/75 (BP Location: Left Arm, Patient Position: Sitting, Cuff Size: Large)   Pulse 67   Temp 98.2 F (36.8 C) (Oral)   Resp 16   Wt (!) 316 lb (143.3 kg)   BMI 42.86 kg/m  Vitals:   06/16/18 0915  BP: 120/75  Pulse: 67  Resp: 16  Temp: 98.2 F (36.8 C)  TempSrc: Oral  Weight: (!) 316 lb (143.3 kg)     Physical Exam  Constitutional: He is oriented to person, place, and time. He appears well-developed and well-nourished.  Cardiovascular: Normal rate.  Pulmonary/Chest: Effort normal.  Musculoskeletal: He  exhibits no edema.       Legs: Area of superficial redness on right medial leg that has receded from place patient has marked with pen.   Neurological: He is alert and oriented to person, place, and time.  Skin: Skin is warm and dry.  Psychiatric: He has a normal mood and affect. His behavior is normal.        Assessment & Plan:     1. Deep vein thrombosis (DVT) of other vein of lower extremity, unspecified chronicity, unspecified laterality (HCC)  INR supratherapeutic today at 3.5. Currently taking warfarin 10 mg daily.   - POCT INR  2.  Thrombophlebitis of superficial veins of right lower extremity  Redness is receding. Apply warm compress. Discussed with Dr. Caryn Section - do not think there will be added benefit to increased warfarin as INR supratherapeutic. He had only restarted his coumadin several days prior and may not have had enough time to build up levels. Will have him return to alternating dosing between 10 mg daily and 5 mg daily. Will see him back in one week to check INR. Please contact podiatry for pain management. Advised that if redness continues to extend we may have to repeat imaging.   Return in about 1 week (around 06/23/2018) for superficial thrombophlebitis .  The entirety of the information documented in the History of Present Illness, Review of Systems and Physical Exam were personally obtained by me. Portions of this information were initially documented by Lyndel Pleasure, CMA and reviewed by me for thoroughness and accuracy.              Trinna Post, PA-C  [facility]

## 2018-06-16 ENCOUNTER — Ambulatory Visit: Payer: 59 | Admitting: Physician Assistant

## 2018-06-16 ENCOUNTER — Encounter: Payer: Self-pay | Admitting: Physician Assistant

## 2018-06-16 VITALS — BP 120/75 | HR 67 | Temp 98.2°F | Resp 16 | Wt 316.0 lb

## 2018-06-16 DIAGNOSIS — I8001 Phlebitis and thrombophlebitis of superficial vessels of right lower extremity: Secondary | ICD-10-CM

## 2018-06-16 DIAGNOSIS — I82499 Acute embolism and thrombosis of other specified deep vein of unspecified lower extremity: Secondary | ICD-10-CM

## 2018-06-16 LAB — POCT INR
INR: 3.5 — AB (ref 2.0–3.0)
PT: 42.5

## 2018-06-16 NOTE — Patient Instructions (Signed)
Deep Vein Thrombosis Deep vein thrombosis (DVT) is a condition in which a blood clot forms in a deep vein, such as a lower leg, thigh, or arm vein. A clot is blood that has thickened into a gel or solid. This condition is dangerous. It can lead to serious and even life-threatening complications if the clot travels to the lungs and causes a blockage (pulmonary embolism). It can also damage veins in the leg. This can result in leg pain, swelling, discoloration, and sores (post-thrombotic syndrome). What are the causes? This condition may be caused by:  A slowdown of blood flow.  Damage to a vein.  A condition that makes blood clot more easily.  What increases the risk? The following factors may make you more likely to develop this condition:  Being overweight.  Being elderly, especially over age 60.  Sitting or lying down for more than four hours.  Lack of physical activity (sedentary lifestyle).  Being pregnant, giving birth, or having recently given birth.  Taking medicines that contain estrogen.  Smoking.  A history of any of the following: ? Blood clots or blood clotting disease. ? Peripheral vascular disease. ? Inflammatory bowel disease. ? Cancer. ? Heart disease. ? Genetic conditions that affect how blood clots. ? Neurological diseases that affect the legs (leg paresis). ? Injury. ? Major or lengthy surgery. ? A central line placed inside a large vein.  What are the signs or symptoms? Symptoms of this condition include:  Swelling, pain, or tenderness in an arm or leg.  Warmth, redness, or discoloration in an arm or leg.  If the clot is in your leg, symptoms may be more noticeable or worse when you stand or walk. Some people do not have any symptoms. How is this diagnosed? This condition is diagnosed with:  A medical history.  A physical exam.  Tests, such as: ? Blood tests. These are done to see how your blood clots. ? Imaging tests. These are done to  check for clots. Tests may include:  Ultrasound.  CT scan.  MRI.  X-ray.  Venogram. For this test, X-rays are taken after a dye is injected into a vein.  How is this treated? Treatment for this condition depends on the cause, your risk for bleeding or developing more clots, and any medical conditions you have. Treatment may include:  Taking blood thinners (also called anticoagulants). These medicines may be taken by mouth, injected under the skin, or injected through an IV tube (catheter). These medicines prevent clots from forming.  Injecting medicine that dissolves blood clots into the affected vein (catheter-directed thrombolysis).  Having surgery. Surgery may be done to: ? Remove the clot. ? Place a filter in a large vein to catch blood clots before they reach the lungs.  Some treatments may be continued for up to six months. Follow these instructions at home: If you are taking an oral blood thinner:  Take the medicine exactly as told by your health care provider. Some blood thinners need to be taken at the same time every day. Do not skip a dose.  Ask your health care provider about what foods and drugs interact with the medicine.  Ask about possible side effects. General instructions  Blood thinners can cause easy bruising and difficulty stopping bleeding. Because of this, if you are taking or were given a blood thinner: ? Hold pressure over cuts for longer than usual. ? Tell your dentist and other health care providers that you are taking blood thinners before   having any procedures that can cause bleeding. ? Avoid contact sports.  Take over-the-counter and prescription medicines only as told by your health care provider.  Return to your normal activities as told by your health care provider. Ask your health care provider what activities are safe for you.  Wear compression stockings if recommended by your health care provider.  Keep all follow-up visits as told by  your health care provider. This is important. How is this prevented? To lower your risk of developing this condition again:  For 30 or more minutes every day, do an activity that: ? Involves moving your arms and legs. ? Increases your heart rate.  When traveling for longer than four hours: ? Exercise your arms and legs every hour. ? Drink plenty of water. ? Avoid drinking alcohol.  Avoid sitting or lying for a long time without moving your legs.  Stay a healthy weight.  If you are a woman who is older than age 35, avoid unnecessary use of medicines that contain estrogen.  Do not use any products that contain nicotine or tobacco, such as cigarettes and e-cigarettes. This is especially important if you take estrogen medicines. If you need help quitting, ask your health care provider.  Contact a health care provider if:  You miss a dose of your blood thinner.  You have nausea, vomiting, or diarrhea that lasts for more than one day.  Your menstrual period is heavier than usual.  You have unusual bruising. Get help right away if:  You have new or increased pain, swelling, or redness in an arm or leg.  You have numbness or tingling in an arm or leg.  You have shortness of breath.  You have chest pain.  You have a rapid or irregular heartbeat.  You feel light-headed or dizzy.  You cough up blood.  There is blood in your vomit, stool, or urine.  You have a serious fall or accident, or you hit your head.  You have a severe headache or confusion.  You have a cut that will not stop bleeding. These symptoms may represent a serious problem that is an emergency. Do not wait to see if the symptoms will go away. Get medical help right away. Call your local emergency services (911 in the [place] Do not drive yourself to the hospital. Summary  DVT is a condition in which a blood clot forms in a deep vein, such as a lower leg, thigh, or arm vein.  Symptoms can include swelling,  warmth, pain, and redness in your leg or arm.  Treatment may include taking blood thinners, injecting medicine that dissolves blood clots,wearing compression stockings, or surgery.  If you are prescribed blood thinners, take them exactly as told. This information is not intended to replace advice given to you by your health care provider. Make sure you discuss any questions you have with your health care provider. Document Released: 06/28/2005 Document Revised: 07/31/2016 Document Reviewed: 07/31/2016 Elsevier Interactive Patient Education  2018 [employer]  

## 2018-06-20 ENCOUNTER — Encounter: Payer: Self-pay | Admitting: Podiatry

## 2018-06-20 ENCOUNTER — Ambulatory Visit (INDEPENDENT_AMBULATORY_CARE_PROVIDER_SITE_OTHER): Payer: 59 | Admitting: Podiatry

## 2018-06-20 VITALS — BP 134/84 | HR 68 | Temp 97.5°F | Resp 16

## 2018-06-20 DIAGNOSIS — I8001 Phlebitis and thrombophlebitis of superficial vessels of right lower extremity: Secondary | ICD-10-CM

## 2018-06-20 DIAGNOSIS — Z9889 Other specified postprocedural states: Secondary | ICD-10-CM

## 2018-06-20 MED ORDER — OXYCODONE-ACETAMINOPHEN 10-325 MG PO TABS: 1.0000 | ORAL_TABLET | ORAL | 0 refills | Status: DC | PRN

## 2018-06-20 NOTE — Progress Notes (Signed)
  Subjective:  Patient ID: Antonio Davis, male    DOB: 02-26-1976,  MRN: [id number]  Chief Complaint  Patient presents with  . Routine Post Op    Pt. states," Pain in my leg is pretty intense; 10/10, but at the surgical sites the pain is fine compared to the leg." )pt denies N/V/F/Ch -w. same amount of swelling and redness in leg Tx: cam boot, keflex, and percocet -pt was admitted to hospital 06/11/18 C/O of R leg swelling, pain and redness and was dx w/ thromboplebiti of superficial vien of R lower ex.    DOS: 05/31/18 Procedure: OGR, Haglund's Resection, Tenolysis Achilles Tendon  42 y.o. male returns for post-op check. History as above. Reports compliance with NWB status. Endorses issue with swelling in the proximal leg worse than the ankle area. Dx with superficial thrombophlebitis. Only resumed his coumadin after his first post-op visit.  Review of Systems: Negative except as noted in the HPI. Denies N/V/F/Ch.  Past Medical History:  Diagnosis Date  . History of DVT (deep vein thrombosis)    R calf; 2010; on Coumadin since  . IBS (irritable bowel syndrome)     Current Outpatient Medications:  .  cephALEXin (KEFLEX) 500 MG capsule, Take 1 capsule (500 mg total) by mouth 3 (three) times daily., Disp: 21 capsule, Rfl: 0 .  diclofenac sodium (VOLTAREN) 1 % GEL, Apply 2 g topically 4 (four) times daily., Disp: 100 g, Rfl: 1 .  ondansetron (ZOFRAN) 4 MG tablet, Take 1 tablet (4 mg total) by mouth every 8 (eight) hours as needed for nausea or vomiting. (Patient not taking: Reported on 06/16/2018), Disp: 20 tablet, Rfl: 0 .  oxyCODONE-acetaminophen (PERCOCET) 10-325 MG tablet, Take 1 tablet by mouth every 4 (four) hours as needed for pain., Disp: 20 tablet, Rfl: 0 .  warfarin (COUMADIN) 5 MG tablet, 10 mg MWF, sat and 5 mg Tue, Thurs and Sun, Disp: 140 tablet, Rfl: 0 .  HYDROmorphone (DILAUDID) 2 MG tablet, Take 1 tablet (2 mg total) by mouth every 4 (four) hours as needed for severe pain.,  Disp: 20 tablet, Rfl: 0  Social History   Tobacco Use  Smoking Status Never Smoker  Smokeless Tobacco Never Used    Allergies  Allergen Reactions  . Talwin [Pentazocine] Other (See Comments)   Objective:   Vitals:   06/13/18 1111  BP: 134/88  Pulse: 73  Resp: 16  Temp: 97.9 F (36.6 C)   There is no height or weight on file to calculate BMI. Constitutional Well developed. Well nourished.  Vascular Foot warm and well perfused. Capillary refill normal to all digits.   Neurologic Normal speech. Oriented to person, place, and time. Epicritic sensation to light touch grossly present bilaterally.  Dermatologic Skin healing well without signs of infection. Skin edges well coapted without signs of infection.  Orthopedic: Tenderness to palpation noted about the surgical site. Edema, warmth, tenderness proximal right thigh   Radiographs: None Assessment:   1. Post-operative state   2. 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  S/p foot surgery right -Progressing as expected post-operatively. -XR: As above -WB Status: NWB in CAM Boot -Sutures: intact. -Medications: Dilaudid PRN pain. -Foot redressed.  Superficial Thrombophlebitis -Continue coumadin -F/u with PCP   No follow-ups on file. Suture removal next visit.

## 2018-06-20 NOTE — Progress Notes (Signed)
  Subjective:  Patient ID: Antonio Davis, male    DOB: 03/02/1976,  MRN: [id number]  Chief Complaint  Patient presents with  . Routine Post Op    Pt.s tates," surgical site don't bother me at all, it only cramps from my knee up to my hip." Tx: cam boot, percocet, and icing -pt denies N/v/F/Ch     DOS: 05/31/18 Procedure: OGR, Haglund's Resection, Tenolysis Achilles Tendon  42 y.o. male returns for post-op check. History as above. Still having more pain in the proximal leg than the surgical site, denies pain in the surgical areas. States INR is >3 currently  Review of Systems: Negative except as noted in the HPI. Denies N/V/F/Ch.  Past Medical History:  Diagnosis Date  . History of DVT (deep vein thrombosis)    R calf; 2010; on Coumadin since  . IBS (irritable bowel syndrome)     Current Outpatient Medications:  .  cephALEXin (KEFLEX) 500 MG capsule, Take 1 capsule (500 mg total) by mouth 3 (three) times daily., Disp: 21 capsule, Rfl: 0 .  diclofenac sodium (VOLTAREN) 1 % GEL, Apply 2 g topically 4 (four) times daily., Disp: 100 g, Rfl: 1 .  ondansetron (ZOFRAN) 4 MG tablet, Take 1 tablet (4 mg total) by mouth every 8 (eight) hours as needed for nausea or vomiting., Disp: 20 tablet, Rfl: 0 .  oxyCODONE-acetaminophen (PERCOCET) 10-325 MG tablet, Take 1 tablet by mouth every 4 (four) hours as needed for pain., Disp: 20 tablet, Rfl: 0 .  warfarin (COUMADIN) 5 MG tablet, 10 mg MWF, sat and 5 mg Tue, Thurs and Sun, Disp: 140 tablet, Rfl: 0  Social History   Tobacco Use  Smoking Status Never Smoker  Smokeless Tobacco Never Used    Allergies  Allergen Reactions  . Talwin [Pentazocine] Other (See Comments)   Objective:   Vitals:   06/20/18 1052  BP: 134/84  Pulse: 68  Resp: 16  Temp: (!) 97.5 F (36.4 C)   There is no height or weight on file to calculate BMI. Constitutional Well developed. Well nourished.  Vascular Foot warm and well perfused. Capillary refill normal to  all digits.   Neurologic Normal speech. Oriented to person, place, and time. Epicritic sensation to light touch grossly present bilaterally.  Dermatologic Skin healing well without signs of infection. Skin edges well coapted without signs of infection.  Orthopedic: Tenderness to palpation noted about the surgical site. Edema, warmth, tenderness proximal right thigh   Radiographs: None Assessment:   1. Post-operative state   2. 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  S/p foot surgery right -Progressing as expected post-operatively. -XR: None today -WB Status: NWB in CAM Boot -Sutures: out today. Staples left in. -Medications: Refilled percocet -Foot redressed.  Superficial Thrombophlebitis -Continue coumadin -INR within therapeutic range. -Keep PCP follow up  No follow-ups on file. for staple removal

## 2018-06-21 ENCOUNTER — Ambulatory Visit: Payer: Self-pay

## 2018-06-27 ENCOUNTER — Ambulatory Visit: Payer: Self-pay

## 2018-06-27 ENCOUNTER — Ambulatory Visit (INDEPENDENT_AMBULATORY_CARE_PROVIDER_SITE_OTHER): Payer: 59 | Admitting: Podiatry

## 2018-06-27 ENCOUNTER — Encounter: Payer: Self-pay | Admitting: Podiatry

## 2018-06-27 VITALS — BP 86/62 | HR 85 | Temp 98.0°F | Resp 16

## 2018-06-27 DIAGNOSIS — Z9889 Other specified postprocedural states: Secondary | ICD-10-CM

## 2018-06-27 MED ORDER — OXYCODONE-ACETAMINOPHEN 5-325 MG PO TABS: 1.0000 | ORAL_TABLET | ORAL | 0 refills | Status: DC | PRN

## 2018-06-27 MED ORDER — CYCLOBENZAPRINE HCL 10 MG PO TABS: 5.0000 mg | ORAL_TABLET | Freq: Three times a day (TID) | ORAL | 0 refills | Status: DC | PRN

## 2018-07-09 NOTE — Progress Notes (Signed)
  Subjective:  Patient ID: Antonio Davis, male    DOB: May 14, 1976,  MRN: [id number]  Chief Complaint  Patient presents with  . Routine Post Op    Pt. states," some weird pain on the back of the heel(2-10) but otherwise it's fine." Tx: pan meds and elevation -pt denies N/V/F?[place]    DOS: 05/31/18 Procedure: OGR, Haglund's Resection, Tenolysis Achilles Tendon  42 y.o. male returns for post-op check. History as above.  Review of Systems: Negative except as noted in the HPI. Denies N/V/F/Ch.  Past Medical History:  Diagnosis Date  . History of DVT (deep vein thrombosis)    R calf; 2010; on Coumadin since  . IBS (irritable bowel syndrome)     Current Outpatient Medications:  .  cephALEXin (KEFLEX) 500 MG capsule, Take 1 capsule (500 mg total) by mouth 3 (three) times daily., Disp: 21 capsule, Rfl: 0 .  diclofenac sodium (VOLTAREN) 1 % GEL, Apply 2 g topically 4 (four) times daily., Disp: 100 g, Rfl: 1 .  ondansetron (ZOFRAN) 4 MG tablet, Take 1 tablet (4 mg total) by mouth every 8 (eight) hours as needed for nausea or vomiting., Disp: 20 tablet, Rfl: 0 .  warfarin (COUMADIN) 5 MG tablet, 10 mg MWF, sat and 5 mg Tue, Thurs and Sun, Disp: 140 tablet, Rfl: 0 .  cyclobenzaprine (FLEXERIL) 10 MG tablet, Take 0.5 tablets (5 mg total) by mouth 3 (three) times daily as needed for muscle spasms., Disp: 30 tablet, Rfl: 0 .  oxyCODONE-acetaminophen (PERCOCET) 5-325 MG tablet, Take 1 tablet by mouth every 4 (four) hours as needed for severe pain., Disp: 20 tablet, Rfl: 0  Social History   Tobacco Use  Smoking Status Never Smoker  Smokeless Tobacco Never Used    Allergies  Allergen Reactions  . Talwin [Pentazocine] Other (See Comments)   Objective:   Vitals:   06/27/18 1500  BP: (!) 86/62  Pulse: 85  Resp: 16  Temp: 98 F (36.7 C)   There is no height or weight on file to calculate BMI. Constitutional Well developed. Well nourished.  Vascular Foot warm and well perfused. Capillary  refill normal to all digits.   Neurologic Normal speech. Oriented to person, place, and time. Epicritic sensation to light touch grossly present bilaterally.  Dermatologic Skin healing well without signs of infection. Skin edges well coapted without signs of infection.  Orthopedic: Tenderness to palpation noted about the surgical site. Edema, warmth, tenderness proximal right thigh   Radiographs: None Assessment:   1. Post-operative state    Plan:  Patient was evaluated and treated and all questions answered.  S/p foot surgery right -Progressing as expected post-operatively. -XR: None today -WB Status: NWB in CAM Boot -Sutures: out. -Foot redressed. -Rx Flexeril for cramp -Refill pain med  Return in about 10 days (around 07/07/2018).

## 2018-07-11 ENCOUNTER — Ambulatory Visit (INDEPENDENT_AMBULATORY_CARE_PROVIDER_SITE_OTHER): Payer: 59 | Admitting: Podiatry

## 2018-07-11 DIAGNOSIS — Z9889 Other specified postprocedural states: Secondary | ICD-10-CM

## 2018-07-11 DIAGNOSIS — I8001 Phlebitis and thrombophlebitis of superficial vessels of right lower extremity: Secondary | ICD-10-CM

## 2018-07-11 NOTE — Progress Notes (Signed)
  Subjective:  Patient ID: Antonio Davis, male    DOB: 24-Aug-1975,  MRN: [id number]  Chief Complaint  Patient presents with  . Routine Post Op     pov#5 dos 11.20.2019 Exc. of Hagulinds Deformity Rt, Tenolysis Rt, Gastrocnemius Recess Rt  doing good pain is undercontrol blood clot is resolving doing PT    DOS: 05/31/18 Procedure: OGR, Haglund's Resection, Tenolysis Achilles Tendon  42 y.o. male returns for post-op check. History as above. Doing great working with PT.  Review of Systems: Negative except as noted in the HPI. Denies N/V/F/Ch.  Past Medical History:  Diagnosis Date  . History of DVT (deep vein thrombosis)    R calf; 2010; on Coumadin since  . IBS (irritable bowel syndrome)     Current Outpatient Medications:  .  cephALEXin (KEFLEX) 500 MG capsule, Take 1 capsule (500 mg total) by mouth 3 (three) times daily., Disp: 21 capsule, Rfl: 0 .  cyclobenzaprine (FLEXERIL) 10 MG tablet, Take 0.5 tablets (5 mg total) by mouth 3 (three) times daily as needed for muscle spasms., Disp: 30 tablet, Rfl: 0 .  diclofenac sodium (VOLTAREN) 1 % GEL, Apply 2 g topically 4 (four) times daily., Disp: 100 g, Rfl: 1 .  ondansetron (ZOFRAN) 4 MG tablet, Take 1 tablet (4 mg total) by mouth every 8 (eight) hours as needed for nausea or vomiting., Disp: 20 tablet, Rfl: 0 .  oxyCODONE-acetaminophen (PERCOCET) 5-325 MG tablet, Take 1 tablet by mouth every 4 (four) hours as needed for severe pain., Disp: 20 tablet, Rfl: 0 .  warfarin (COUMADIN) 5 MG tablet, 10 mg MWF, sat and 5 mg Tue, Thurs and Sun, Disp: 140 tablet, Rfl: 0  Social History   Tobacco Use  Smoking Status Never Smoker  Smokeless Tobacco Never Used    Allergies  Allergen Reactions  . Talwin [Pentazocine] Other (See Comments)   Objective:   There were no vitals filed for this visit. There is no height or weight on file to calculate BMI. Constitutional Well developed. Well nourished.  Vascular Foot warm and well  perfused. Capillary refill normal to all digits.   Neurologic Normal speech. Oriented to person, place, and time. Epicritic sensation to light touch grossly present bilaterally.  Dermatologic Skin healing well without signs of infection. Skin edges well coapted without signs of infection.  Orthopedic: Tenderness to palpation noted about the surgical site. Edema, warmth, tenderness resolving proximal right thigh Good Achilles ROM and strength.   Radiographs: None Assessment:   1. Post-operative state   2. 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  S/p foot surgery right -Progressing as expected post-operatively. -XR: None today -WB Status: Start WB progression with PT.  WB only to be progressed by PT. -Sutures: out. Staples removed. Ok to shower but not soak. -Foot redressed. Continue ACE bandage. -No meds refilled today.  Return in about 2 weeks (around 07/25/2018) for Post-op.

## 2018-07-19 ENCOUNTER — Telehealth: Payer: Self-pay | Admitting: *Deleted

## 2018-07-19 ENCOUNTER — Other Ambulatory Visit: Payer: Self-pay | Admitting: Podiatry

## 2018-07-19 ENCOUNTER — Encounter: Payer: Self-pay | Admitting: *Deleted

## 2018-07-19 MED ORDER — CEPHALEXIN 500 MG PO CAPS: 500.0000 mg | ORAL_CAPSULE | Freq: Three times a day (TID) | ORAL | 0 refills | Status: DC

## 2018-07-19 NOTE — Telephone Encounter (Signed)
Patient called to see if his HR had faxed over paperwork to extend his FMLA.  I have not got all pertinent information necessary to contact them.   Patient states he is still having a lot of pain but due to current circumstances is not taking pain meds.  Wife has noticed that the last inch or so of the suture line towards the bottom of the heel is oozing a yellow puss with a slight odor.  She is currently flushing the area well and applying antibiotic ointment with a band aid.  Is requesting advise for treatment.

## 2018-07-19 NOTE — Telephone Encounter (Signed)
Yes I will call and get him scheduled

## 2018-07-19 NOTE — Telephone Encounter (Signed)
Continue flushing and applying ointment, will call Rx in to the pharmacy. Do you think Dr. Cannon Kettle could check him out Thursday or Friday?

## 2018-07-20 MED ORDER — OXYCODONE-ACETAMINOPHEN 5-325 MG PO TABS: 1.0000 | ORAL_TABLET | ORAL | 0 refills | Status: DC | PRN

## 2018-07-20 NOTE — Telephone Encounter (Signed)
Patient has been notified of Rx's and is scheduled for an appt with Dr Cannon Kettle on Friday to check the wound

## 2018-07-21 ENCOUNTER — Encounter: Payer: Self-pay | Admitting: Sports Medicine

## 2018-07-21 ENCOUNTER — Ambulatory Visit (INDEPENDENT_AMBULATORY_CARE_PROVIDER_SITE_OTHER): Payer: 59 | Admitting: Sports Medicine

## 2018-07-21 VITALS — BP 144/92 | HR 86 | Temp 97.7°F | Resp 14

## 2018-07-21 DIAGNOSIS — T8130XA Disruption of wound, unspecified, initial encounter: Secondary | ICD-10-CM

## 2018-07-21 DIAGNOSIS — I8001 Phlebitis and thrombophlebitis of superficial vessels of right lower extremity: Secondary | ICD-10-CM

## 2018-07-21 DIAGNOSIS — Z9889 Other specified postprocedural states: Secondary | ICD-10-CM

## 2018-07-21 NOTE — Progress Notes (Addendum)
Subjective: Antonio Davis is a 43 y.o. male patient seen today in office for POV wound check (DOS 05/31/2018), S/P excision of Haglund's deformity, tenolysis, gastroc recession performed by Dr. March Rummage.  Patient also has a venous thrombosis and reports that he is doing good with his blood clot being under control. Patient admits a little pain at surgical site, denies calf pain, denies headache, chest pain, shortness of breath, nausea, vomiting, fever, or chills. Patient states that there has been yellow to bloody drainage with redness and swelling and states that he has been up moving around a lot since his father passed away and the funeral was yesterday.  No other issues noted.   Patient Active Problem List   Diagnosis Date Noted  . Encounter for therapeutic drug monitoring 04/12/2017  . DVT (deep venous thrombosis) ([place]) 04/12/2017  . Hypercoagulable state ([place]) 04/12/2017  . History of blood clots 03/15/2017  . IBS (irritable bowel syndrome) 03/15/2017  . History of Coumadin therapy 03/15/2017  . Screening cholesterol level 03/15/2017  . Screening for diabetes mellitus 03/15/2017  . Healthcare maintenance 03/15/2017    Current Outpatient Medications on File Prior to Visit  Medication Sig Dispense Refill  . cephALEXin (KEFLEX) 500 MG capsule Take 1 capsule (500 mg total) by mouth 3 (three) times daily. 21 capsule 0  . cyclobenzaprine (FLEXERIL) 10 MG tablet Take 0.5 tablets (5 mg total) by mouth 3 (three) times daily as needed for muscle spasms. 30 tablet 0  . diclofenac sodium (VOLTAREN) 1 % GEL Apply 2 g topically 4 (four) times daily. 100 g 1  . ondansetron (ZOFRAN) 4 MG tablet Take 1 tablet (4 mg total) by mouth every 8 (eight) hours as needed for nausea or vomiting. 20 tablet 0  . oxyCODONE-acetaminophen (PERCOCET) 5-325 MG tablet Take 1 tablet by mouth every 4 (four) hours as needed for severe pain. 20 tablet 0  . warfarin (COUMADIN) 5 MG tablet 10 mg MWF, sat and 5 mg Tue, Thurs and  Sun 140 tablet 0   No current facility-administered medications on file prior to visit.     Allergies  Allergen Reactions  . Talwin [Pentazocine] Other (See Comments)    Objective: There were no vitals filed for this visit.  General: No acute distress, AAOx3  Right foot: Small area of wound dehiscence along the previous surgical incision line that measures at the distal aspect 2 x 0.8 cm and at the superior aspect measures 0.5 x 0.5 all partial-thickness with slight exposure of subcutaneous tissue noted at the superior dehiscence of wound with mild maceration and a mix of fibro-granular tissue with mild soft tissue swelling mild edema mild focal erythema, mild tenderness to palpation, no pain or crepitation with range of motion right foot.Marland Kitchen  No pain with calf compression.  Previous history of blood clot.   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 Wound measures post debridement as above.  Wound was debrided to the level of the dermis with viable wound base exposed to promote healing. Hemostasis was achieved with manuel pressure. Patient tolerated procedure  well without any discomfort or anesthesia necessary for this wound debridement.  -Applied Iodosorb and dry sterile dressing and instructed patient to continue with daily dressings if draining however if no drainage may switch to every other day dressing changes at home consisting of the same -Continue with oral antibiotics -Continue with rest ice elevation and anticoagulant therapy for thrombus -Continue with weightbearing with progression with physical therapy as long as it is not  creating worsening dehiscence - Advised patient to go to the ER or return to office if the wound worsens or if constitutional symptoms are present. -Patient to return as scheduled to see Dr. March Rummage or sooner if problems or issues arise.Landis Martins, DPM

## 2018-07-25 ENCOUNTER — Ambulatory Visit (INDEPENDENT_AMBULATORY_CARE_PROVIDER_SITE_OTHER): Payer: 59 | Admitting: Podiatry

## 2018-07-25 ENCOUNTER — Encounter: Payer: Self-pay | Admitting: Podiatry

## 2018-07-25 VITALS — BP 139/75 | HR 73 | Temp 97.7°F | Resp 16

## 2018-07-25 DIAGNOSIS — Z9889 Other specified postprocedural states: Secondary | ICD-10-CM

## 2018-07-25 DIAGNOSIS — T8130XA Disruption of wound, unspecified, initial encounter: Secondary | ICD-10-CM

## 2018-07-25 MED ORDER — HYDROCODONE-ACETAMINOPHEN 5-325 MG PO TABS: 1.0000 | ORAL_TABLET | Freq: Four times a day (QID) | ORAL | 0 refills | Status: DC | PRN

## 2018-07-25 NOTE — Progress Notes (Signed)
  Subjective:  Patient ID: Antonio Davis, male    DOB: [birth year],  MRN: [id number]  Chief Complaint  Patient presents with  . Routine Post Op    PT. states," Sunday my wife rinsed it with the bug juice since it was draining a lot (yellow drainage), but otherwise the ointment seems to be helping a lot, it looks better." Tx: iodosorb, dressing change, bug juice, keflex, and PT -pt deneis N/V//F/Ch    DOS: 05/31/18 Procedure: OGR, Haglund's Resection, Tenolysis Achilles Tendon  43 y.o. male returns for post-op check. History as above. Doing great working with PT.  Review of Systems: Negative except as noted in the HPI. Denies N/V/F/Ch.  Past Medical History:  Diagnosis Date  . History of DVT (deep vein thrombosis)    R calf; 2010; on Coumadin since  . IBS (irritable bowel syndrome)     Current Outpatient Medications:  .  cephALEXin (KEFLEX) 500 MG capsule, Take 1 capsule (500 mg total) by mouth 3 (three) times daily., Disp: 21 capsule, Rfl: 0 .  cyclobenzaprine (FLEXERIL) 10 MG tablet, Take 0.5 tablets (5 mg total) by mouth 3 (three) times daily as needed for muscle spasms., Disp: 30 tablet, Rfl: 0 .  diclofenac sodium (VOLTAREN) 1 % GEL, Apply 2 g topically 4 (four) times daily., Disp: 100 g, Rfl: 1 .  ondansetron (ZOFRAN) 4 MG tablet, Take 1 tablet (4 mg total) by mouth every 8 (eight) hours as needed for nausea or vomiting., Disp: 20 tablet, Rfl: 0 .  warfarin (COUMADIN) 5 MG tablet, 10 mg MWF, sat and 5 mg Tue, Thurs and Sun, Disp: 140 tablet, Rfl: 0 .  HYDROcodone-acetaminophen (NORCO) 5-325 MG tablet, Take 1 tablet by mouth every 6 (six) hours as needed for moderate pain., Disp: 20 tablet, Rfl: 0  Social History   Tobacco Use  Smoking Status Never Smoker  Smokeless Tobacco Never Used    Allergies  Allergen Reactions  . Talwin [Pentazocine] Other (See Comments)   Objective:   Vitals:   07/25/18 1141  BP: 139/75  Pulse: 73  Resp: 16  Temp: 97.7 F (36.5 C)   There  is no height or weight on file to calculate BMI. Constitutional Well developed. Well nourished.  Vascular Foot warm and well perfused. Capillary refill normal to all digits.   Neurologic Normal speech. Oriented to person, place, and time. Epicritic sensation to light touch grossly present bilaterally.  Dermatologic Only small dehiscence of incision superficial without signs of infection.  Orthopedic: Tenderness to palpation noted about the surgical site. Edema, warmth, tenderness resolving proximal right thigh Good Achilles ROM and strength.   Radiographs: None Assessment:   1. Wound dehiscence   2. Post-operative state    Plan:  Patient was evaluated and treated and all questions answered.  S/p foot surgery right -Progressing as expected post-operatively. -XR: None today -WB Status: Continue WB progression -Sutures:out -Wound healing. Continue iodosorb. -Foot redressed. Continue ACE bandage. -Refill norco  Return in about 2 weeks (around 08/08/2018).

## 2018-08-02 ENCOUNTER — Encounter: Payer: Self-pay | Admitting: *Deleted

## 2018-08-08 ENCOUNTER — Encounter: Payer: Self-pay | Admitting: Podiatry

## 2018-08-08 ENCOUNTER — Ambulatory Visit (INDEPENDENT_AMBULATORY_CARE_PROVIDER_SITE_OTHER): Payer: 59 | Admitting: Podiatry

## 2018-08-08 VITALS — BP 131/67 | HR 80 | Temp 97.9°F | Resp 16

## 2018-08-08 DIAGNOSIS — Z9889 Other specified postprocedural states: Secondary | ICD-10-CM

## 2018-08-08 NOTE — Patient Instructions (Signed)
Continue with the CAM boot for 1 more week Switch to brace and sneaker after 1 week Weightbearing with two crutches for 3 days Weightbearing with one crutch (on right side) for 3 days Weightbearing without crutches  If at any point you are having pain doing these, revert one step back for an additional 3 days.

## 2018-08-08 NOTE — Progress Notes (Signed)
  Subjective:  Patient ID: Antonio Davis, male    DOB: 09/12/1975,  MRN: [id number]  Chief Complaint  Patient presents with  . Routine Post Op    dos 11.20.2019 Exc. of Hagulinds Deformity Rt, Tenolysis Rt, Gastrocnemius Recess Rt PT.s tates," pain once in a while only if I over do it; 6/10, otherwise it's fine." Tx: cam boot and icing -pt denies N/V/F?[place]   DOS: 05/31/18 Procedure: OGR, Haglund's Resection, Tenolysis Achilles Tendon  43 y.o. male returns for post-op check. History as above.   Review of Systems: Negative except as noted in the HPI. Denies N/V/F/Ch.  Past Medical History:  Diagnosis Date  . History of DVT (deep vein thrombosis)    R calf; 2010; on Coumadin since  . IBS (irritable bowel syndrome)     Current Outpatient Medications:  .  cephALEXin (KEFLEX) 500 MG capsule, Take 1 capsule (500 mg total) by mouth 3 (three) times daily., Disp: 21 capsule, Rfl: 0 .  cyclobenzaprine (FLEXERIL) 10 MG tablet, Take 0.5 tablets (5 mg total) by mouth 3 (three) times daily as needed for muscle spasms., Disp: 30 tablet, Rfl: 0 .  diclofenac sodium (VOLTAREN) 1 % GEL, Apply 2 g topically 4 (four) times daily., Disp: 100 g, Rfl: 1 .  HYDROcodone-acetaminophen (NORCO) 5-325 MG tablet, Take 1 tablet by mouth every 6 (six) hours as needed for moderate pain., Disp: 20 tablet, Rfl: 0 .  ondansetron (ZOFRAN) 4 MG tablet, Take 1 tablet (4 mg total) by mouth every 8 (eight) hours as needed for nausea or vomiting., Disp: 20 tablet, Rfl: 0 .  warfarin (COUMADIN) 5 MG tablet, 10 mg MWF, sat and 5 mg Tue, Thurs and Sun, Disp: 140 tablet, Rfl: 0  Social History   Tobacco Use  Smoking Status Never Smoker  Smokeless Tobacco Never Used    Allergies  Allergen Reactions  . Talwin [Pentazocine] Other (See Comments)   Objective:   Vitals:   08/08/18 1316  BP: 131/67  Pulse: 80  Resp: 16  Temp: 97.9 F (36.6 C)   There is no height or weight on file to calculate BMI. Constitutional Well  developed. Well nourished.  Vascular Foot warm and well perfused. Capillary refill normal to all digits.   Neurologic Normal speech. Oriented to person, place, and time. Epicritic sensation to light touch grossly present bilaterally.  Dermatologic Wound healed only slight overlying scab.  Orthopedic: Tenderness to palpation noted about the surgical site. SVT resolving Achilles strength near optimal right. ROM to -5 deg DF.   Radiographs: None Assessment:   1. Post-operative state    Plan:  Patient was evaluated and treated and all questions answered.  S/p foot surgery right -Progressing as expected post-operatively. -XR: None today -WB Status: WBAT in CAM Boot. Written instructions for WB progression. Should be WBAT in normal shoegear without assistive device at next visit. -Sutures:out -Wound healed.  Return in about 4 weeks (around 09/05/2018).

## 2018-09-05 ENCOUNTER — Encounter: Payer: 59 | Admitting: Podiatry

## 2018-09-11 ENCOUNTER — Encounter: Payer: Self-pay | Admitting: *Deleted

## 2018-09-11 ENCOUNTER — Ambulatory Visit (INDEPENDENT_AMBULATORY_CARE_PROVIDER_SITE_OTHER): Payer: 59

## 2018-09-11 ENCOUNTER — Ambulatory Visit (INDEPENDENT_AMBULATORY_CARE_PROVIDER_SITE_OTHER): Payer: 59 | Admitting: Podiatry

## 2018-09-11 ENCOUNTER — Encounter: Payer: Self-pay | Admitting: Podiatry

## 2018-09-11 VITALS — BP 138/85 | HR 69 | Temp 97.7°F | Resp 16

## 2018-09-11 DIAGNOSIS — I8001 Phlebitis and thrombophlebitis of superficial vessels of right lower extremity: Secondary | ICD-10-CM

## 2018-09-11 DIAGNOSIS — Z9889 Other specified postprocedural states: Secondary | ICD-10-CM

## 2018-09-11 DIAGNOSIS — M9261 Juvenile osteochondrosis of tarsus, right ankle: Secondary | ICD-10-CM | POA: Diagnosis not present

## 2018-09-11 DIAGNOSIS — T8130XA Disruption of wound, unspecified, initial encounter: Secondary | ICD-10-CM

## 2018-09-11 NOTE — Progress Notes (Signed)
  Subjective:  Patient ID: Antonio Davis, male    DOB: December 13, 1975,  MRN: [id number]  Chief Complaint  Patient presents with  . Routine Post Op     pov#8 dos 11.20.2019 Exc. of Hagulinds Deformity Rt, Tenolysis Rt,Gastrocnemius Recess Rt PT.s tates," it's still stiff and it hurts when I walk; 4/10 constant pain." tx: icing -pt deneis any other issues    DOS: 05/31/18 Procedure: OGR, Haglund's Resection, Tenolysis Achilles Tendon  43 y.o. male returns for post-op check. History as above. Doing well ambulating in normal shoegear with only minimal pain.  Review of Systems: Negative except as noted in the HPI. Denies N/V/F/Ch.  Past Medical History:  Diagnosis Date  . History of DVT (deep vein thrombosis)    R calf; 2010; on Coumadin since  . IBS (irritable bowel syndrome)     Current Outpatient Medications:  .  cephALEXin (KEFLEX) 500 MG capsule, Take 1 capsule (500 mg total) by mouth 3 (three) times daily., Disp: 21 capsule, Rfl: 0 .  cyclobenzaprine (FLEXERIL) 10 MG tablet, Take 0.5 tablets (5 mg total) by mouth 3 (three) times daily as needed for muscle spasms., Disp: 30 tablet, Rfl: 0 .  diclofenac sodium (VOLTAREN) 1 % GEL, Apply 2 g topically 4 (four) times daily., Disp: 100 g, Rfl: 1 .  HYDROcodone-acetaminophen (NORCO) 5-325 MG tablet, Take 1 tablet by mouth every 6 (six) hours as needed for moderate pain., Disp: 20 tablet, Rfl: 0 .  ondansetron (ZOFRAN) 4 MG tablet, Take 1 tablet (4 mg total) by mouth every 8 (eight) hours as needed for nausea or vomiting., Disp: 20 tablet, Rfl: 0 .  warfarin (COUMADIN) 5 MG tablet, 10 mg MWF, sat and 5 mg Tue, Thurs and Sun, Disp: 140 tablet, Rfl: 0  Social History   Tobacco Use  Smoking Status Never Smoker  Smokeless Tobacco Never Used    Allergies  Allergen Reactions  . Talwin [Pentazocine] Other (See Comments)   Objective:   Vitals:   09/11/18 0913  BP: 138/85  Pulse: 69  Resp: 16  Temp: 97.7 F (36.5 C)   There is no  height or weight on file to calculate BMI. Constitutional Well developed. Well nourished.  Vascular Foot warm and well perfused. Capillary refill normal to all digits.   Neurologic Normal speech. Oriented to person, place, and time. Epicritic sensation to light touch grossly present bilaterally.  Dermatologic Wound healed  Orthopedic: No tenderness to palpation noted about the surgical site. Achilles strength optimal right. ROM to -5 deg DF.   Radiographs: Taken and reviewed c/w post-op state. Assessment:   1. Post-operative state   2. 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 Wound dehiscence   4. Haglund's deformity of right heel    Plan:  Patient was evaluated and treated and all questions answered.  S/p foot surgery right -Progressing as expected post-operatively. -XR: None today -WB Status: WBAT in normal shoegear -Sutures:out -Work on continued stretching and ROM exercises -Will look into CMO coverage. -Rx compound pain cream for nerve symptoms and scarring.  Return if symptoms worsen or fail to improve.

## 2018-09-12 ENCOUNTER — Telehealth: Payer: Self-pay | Admitting: *Deleted

## 2018-09-12 MED ORDER — NONFORMULARY OR COMPOUNDED ITEM: 5 refills | Status: DC

## 2018-09-12 NOTE — Telephone Encounter (Signed)
Faxed orders to [facility]. 

## 2018-09-12 NOTE — Telephone Encounter (Signed)
-----   Message from Evelina Bucy, DPM sent at 09/11/2018  9:31 AM EST ----- Can we order compound cream from [facility] - the nerve formulation with the added Verapamil I believe for scars?

## 2018-11-01 ENCOUNTER — Ambulatory Visit (INDEPENDENT_AMBULATORY_CARE_PROVIDER_SITE_OTHER): Payer: 59 | Admitting: Physician Assistant

## 2018-11-01 DIAGNOSIS — I82499 Acute embolism and thrombosis of other specified deep vein of unspecified lower extremity: Secondary | ICD-10-CM

## 2018-11-01 NOTE — Progress Notes (Signed)
Patient not in doxy[facility]me waiting room. Called patient room and he was at work and unable to get off due to emergency. Rescheduled tomorrow with Tawanna Sat.

## 2018-11-02 ENCOUNTER — Other Ambulatory Visit: Payer: Self-pay | Admitting: *Deleted

## 2018-11-02 ENCOUNTER — Ambulatory Visit (INDEPENDENT_AMBULATORY_CARE_PROVIDER_SITE_OTHER): Payer: 59 | Admitting: Physician Assistant

## 2018-11-02 DIAGNOSIS — I82499 Acute embolism and thrombosis of other specified deep vein of unspecified lower extremity: Secondary | ICD-10-CM

## 2018-11-02 LAB — PROTIME-INR
INR: 2.3 — AB (ref 0.9–1.1)
PT: 27.7

## 2018-11-02 MED ORDER — WARFARIN SODIUM 5 MG PO TABS: ORAL_TABLET | ORAL | 5 refills | Status: DC

## 2018-11-02 MED ORDER — WARFARIN SODIUM 5 MG PO TABS: ORAL_TABLET | ORAL | 0 refills | Status: DC

## 2018-11-02 NOTE — Patient Instructions (Signed)
Take 10 mg M,W,F, Sat and 5 mg T, Th, Sun Recheck in 4 weeks

## 2018-11-03 ENCOUNTER — Ambulatory Visit: Payer: Self-pay | Admitting: Physician Assistant

## 2018-11-30 ENCOUNTER — Ambulatory Visit: Payer: Self-pay | Admitting: Physician Assistant

## 2019-03-24 ENCOUNTER — Emergency Department (HOSPITAL_COMMUNITY): Payer: 59

## 2019-03-24 ENCOUNTER — Emergency Department (HOSPITAL_COMMUNITY): Payer: 59 | Admitting: Certified Registered"

## 2019-03-24 ENCOUNTER — Encounter (HOSPITAL_COMMUNITY)
Admission: EM | Disposition: A | Discharge status: Home / Self Care | Payer: Self-pay | Source: Home / Self Care | Attending: Emergency Medicine

## 2019-03-24 ENCOUNTER — Encounter (HOSPITAL_COMMUNITY): Payer: Self-pay

## 2019-03-24 ENCOUNTER — Observation Stay (HOSPITAL_COMMUNITY): Payer: 59

## 2019-03-24 ENCOUNTER — Other Ambulatory Visit: Payer: Self-pay

## 2019-03-24 ENCOUNTER — Observation Stay (HOSPITAL_COMMUNITY)
Admission: EM | Admit: 2019-03-24 | Discharge: 2019-03-25 | Disposition: A | Discharge status: Home / Self Care | Payer: 59 | Attending: Orthopedic Surgery | Admitting: Orthopedic Surgery

## 2019-03-24 DIAGNOSIS — Z20828 Contact with and (suspected) exposure to other viral communicable diseases: Secondary | ICD-10-CM | POA: Diagnosis not present

## 2019-03-24 DIAGNOSIS — Z6841 Body Mass Index (BMI) 40.0 and over, adult: Secondary | ICD-10-CM | POA: Diagnosis not present

## 2019-03-24 DIAGNOSIS — X501XXA Overexertion from prolonged static or awkward postures, initial encounter: Secondary | ICD-10-CM | POA: Insufficient documentation

## 2019-03-24 DIAGNOSIS — Z86718 Personal history of other venous thrombosis and embolism: Secondary | ICD-10-CM | POA: Diagnosis not present

## 2019-03-24 DIAGNOSIS — S82852A Displaced trimalleolar fracture of left lower leg, initial encounter for closed fracture: Secondary | ICD-10-CM | POA: Diagnosis not present

## 2019-03-24 DIAGNOSIS — Z7901 Long term (current) use of anticoagulants: Secondary | ICD-10-CM | POA: Insufficient documentation

## 2019-03-24 DIAGNOSIS — M25572 Pain in left ankle and joints of left foot: Secondary | ICD-10-CM | POA: Diagnosis present

## 2019-03-24 DIAGNOSIS — S82892A Other fracture of left lower leg, initial encounter for closed fracture: Secondary | ICD-10-CM

## 2019-03-24 HISTORY — PX: EXTERNAL FIXATION LEG: SHX1549

## 2019-03-24 LAB — PROTIME-INR
INR: 2 — ABNORMAL HIGH (ref 0.8–1.2)
Prothrombin Time: 22.7 seconds — ABNORMAL HIGH (ref 11.4–15.2)

## 2019-03-24 LAB — SARS CORONAVIRUS 2 BY RT PCR (HOSPITAL ORDER, PERFORMED IN ~~LOC~~ HOSPITAL LAB): SARS Coronavirus 2: NEGATIVE

## 2019-03-24 SURGERY — EXTERNAL FIXATION, LOWER EXTREMITY
Anesthesia: General | Site: Leg Lower | Laterality: Left

## 2019-03-24 MED ORDER — METHOCARBAMOL 1000 MG/10ML IJ SOLN
500.0000 mg | Freq: Four times a day (QID) | INTRAVENOUS | Status: DC | PRN
  Filled 2019-03-24: qty 5

## 2019-03-24 MED ORDER — BISACODYL 10 MG RE SUPP: 10.0000 mg | Freq: Every day | RECTAL | Status: DC | PRN

## 2019-03-24 MED ORDER — METOCLOPRAMIDE HCL 5 MG PO TABS: 5.0000 mg | ORAL_TABLET | Freq: Three times a day (TID) | ORAL | Status: DC | PRN

## 2019-03-24 MED ORDER — PROPOFOL 10 MG/ML IV BOLUS
INTRAVENOUS | Status: AC
  Filled 2019-03-24: qty 20

## 2019-03-24 MED ORDER — SODIUM CHLORIDE 0.9 % IV SOLN
INTRAVENOUS | Status: DC | PRN
  Administered 2019-03-24: 50 ug/min via INTRAVENOUS

## 2019-03-24 MED ORDER — POVIDONE-IODINE 10 % EX SWAB: 2.0000 "application " | Freq: Once | CUTANEOUS | Status: DC

## 2019-03-24 MED ORDER — SUCCINYLCHOLINE CHLORIDE 20 MG/ML IJ SOLN
INTRAMUSCULAR | Status: DC | PRN
  Administered 2019-03-24: 160 mg via INTRAVENOUS

## 2019-03-24 MED ORDER — DOCUSATE SODIUM 100 MG PO CAPS
100.0000 mg | ORAL_CAPSULE | Freq: Two times a day (BID) | ORAL | Status: DC
  Administered 2019-03-24 – 2019-03-25 (×2): 100 mg via ORAL
  Filled 2019-03-24 (×2): qty 1

## 2019-03-24 MED ORDER — METOCLOPRAMIDE HCL 5 MG/ML IJ SOLN: 5.0000 mg | Freq: Three times a day (TID) | INTRAMUSCULAR | Status: DC | PRN

## 2019-03-24 MED ORDER — PROPOFOL 10 MG/ML IV BOLUS
80.0000 mg | Freq: Once | INTRAVENOUS | Status: DC
  Filled 2019-03-24: qty 20

## 2019-03-24 MED ORDER — DIPHENHYDRAMINE HCL 12.5 MG/5ML PO ELIX: 12.5000 mg | ORAL_SOLUTION | ORAL | Status: DC | PRN

## 2019-03-24 MED ORDER — ONDANSETRON HCL 4 MG/2ML IJ SOLN
4.0000 mg | Freq: Once | INTRAMUSCULAR | Status: AC
  Administered 2019-03-24: 4 mg via INTRAVENOUS
  Filled 2019-03-24: qty 2

## 2019-03-24 MED ORDER — LACTATED RINGERS IV SOLN
INTRAVENOUS | Status: DC | PRN
  Administered 2019-03-24: 17:00:00 via INTRAVENOUS

## 2019-03-24 MED ORDER — HYDROMORPHONE HCL 1 MG/ML IJ SOLN
0.5000 mg | INTRAMUSCULAR | Status: DC | PRN
  Administered 2019-03-24: 1 mg via INTRAVENOUS
  Filled 2019-03-24: qty 1

## 2019-03-24 MED ORDER — CHLORHEXIDINE GLUCONATE 4 % EX LIQD: 60.0000 mL | Freq: Once | CUTANEOUS | Status: DC

## 2019-03-24 MED ORDER — POTASSIUM CHLORIDE IN NACL 20-0.45 MEQ/L-% IV SOLN
INTRAVENOUS | Status: DC
  Administered 2019-03-24: 21:00:00 via INTRAVENOUS
  Filled 2019-03-24 (×3): qty 1000

## 2019-03-24 MED ORDER — CEFAZOLIN SODIUM-DEXTROSE 1-4 GM/50ML-% IV SOLN
1.0000 g | Freq: Four times a day (QID) | INTRAVENOUS | Status: AC
  Administered 2019-03-24 – 2019-03-25 (×3): 1 g via INTRAVENOUS
  Filled 2019-03-24 (×2): qty 50

## 2019-03-24 MED ORDER — OXYCODONE HCL 5 MG/5ML PO SOLN: 5.0000 mg | Freq: Once | ORAL | Status: DC | PRN

## 2019-03-24 MED ORDER — KETAMINE HCL 50 MG/5ML IJ SOSY
70.0000 mg | PREFILLED_SYRINGE | Freq: Once | INTRAMUSCULAR | Status: AC
  Administered 2019-03-24: 70 mg via INTRAVENOUS
  Filled 2019-03-24: qty 10

## 2019-03-24 MED ORDER — WARFARIN SODIUM 5 MG PO TABS
10.0000 mg | ORAL_TABLET | Freq: Once | ORAL | Status: AC
  Administered 2019-03-24: 10 mg via ORAL
  Filled 2019-03-24: qty 2

## 2019-03-24 MED ORDER — LACTATED RINGERS IV SOLN: INTRAVENOUS | Status: DC

## 2019-03-24 MED ORDER — ONDANSETRON HCL 4 MG/2ML IJ SOLN
4.0000 mg | Freq: Four times a day (QID) | INTRAMUSCULAR | Status: DC | PRN
  Administered 2019-03-24 – 2019-03-25 (×2): 4 mg via INTRAVENOUS
  Filled 2019-03-24 (×2): qty 2

## 2019-03-24 MED ORDER — OXYCODONE HCL 5 MG PO TABS: 5.0000 mg | ORAL_TABLET | ORAL | Status: DC | PRN

## 2019-03-24 MED ORDER — MIDAZOLAM HCL 2 MG/2ML IJ SOLN
INTRAMUSCULAR | Status: AC
  Filled 2019-03-24: qty 2

## 2019-03-24 MED ORDER — ZOLPIDEM TARTRATE 5 MG PO TABS: 5.0000 mg | ORAL_TABLET | Freq: Every evening | ORAL | Status: DC | PRN

## 2019-03-24 MED ORDER — FENTANYL CITRATE (PF) 100 MCG/2ML IJ SOLN
25.0000 ug | INTRAMUSCULAR | Status: DC | PRN
  Administered 2019-03-24: 50 ug via INTRAVENOUS
  Administered 2019-03-24: 18:00:00 25 ug via INTRAVENOUS

## 2019-03-24 MED ORDER — MIDAZOLAM HCL 5 MG/5ML IJ SOLN
INTRAMUSCULAR | Status: DC | PRN
  Administered 2019-03-24 (×2): 1 mg via INTRAVENOUS

## 2019-03-24 MED ORDER — METHOCARBAMOL 500 MG PO TABS
500.0000 mg | ORAL_TABLET | Freq: Four times a day (QID) | ORAL | Status: DC | PRN
  Administered 2019-03-24: 500 mg via ORAL
  Filled 2019-03-24: qty 1

## 2019-03-24 MED ORDER — MAGNESIUM CITRATE PO SOLN: 1.0000 | Freq: Once | ORAL | Status: DC | PRN

## 2019-03-24 MED ORDER — FENTANYL CITRATE (PF) 250 MCG/5ML IJ SOLN
INTRAMUSCULAR | Status: AC
  Filled 2019-03-24: qty 5

## 2019-03-24 MED ORDER — ONDANSETRON HCL 4 MG/2ML IJ SOLN: 4.0000 mg | Freq: Four times a day (QID) | INTRAMUSCULAR | Status: DC | PRN

## 2019-03-24 MED ORDER — FENTANYL CITRATE (PF) 100 MCG/2ML IJ SOLN
INTRAMUSCULAR | Status: AC
  Administered 2019-03-24: 25 ug via INTRAVENOUS
  Filled 2019-03-24: qty 2

## 2019-03-24 MED ORDER — PHENYLEPHRINE HCL (PRESSORS) 10 MG/ML IV SOLN
INTRAVENOUS | Status: DC | PRN
  Administered 2019-03-24 (×2): 40 ug via INTRAVENOUS

## 2019-03-24 MED ORDER — WARFARIN - PHARMACIST DOSING INPATIENT: Freq: Every day | Status: DC

## 2019-03-24 MED ORDER — PROPOFOL 10 MG/ML IV BOLUS
INTRAVENOUS | Status: DC | PRN
  Administered 2019-03-24: 200 mg via INTRAVENOUS

## 2019-03-24 MED ORDER — ACETAMINOPHEN 325 MG PO TABS: 325.0000 mg | ORAL_TABLET | Freq: Four times a day (QID) | ORAL | Status: DC | PRN

## 2019-03-24 MED ORDER — DEXTROSE 5 % IV SOLN
3.0000 g | INTRAVENOUS | Status: DC
  Filled 2019-03-24: qty 3000

## 2019-03-24 MED ORDER — POLYETHYLENE GLYCOL 3350 17 G PO PACK: 17.0000 g | PACK | Freq: Every day | ORAL | Status: DC | PRN

## 2019-03-24 MED ORDER — PROPOFOL 10 MG/ML IV BOLUS
INTRAVENOUS | Status: AC | PRN
  Administered 2019-03-24: 80 mg via INTRAVENOUS
  Administered 2019-03-24: 20 mg via INTRAVENOUS

## 2019-03-24 MED ORDER — HYDROMORPHONE HCL 1 MG/ML IJ SOLN
1.0000 mg | Freq: Once | INTRAMUSCULAR | Status: AC
  Administered 2019-03-24: 1 mg via INTRAVENOUS
  Filled 2019-03-24: qty 1

## 2019-03-24 MED ORDER — LIDOCAINE 2% (20 MG/ML) 5 ML SYRINGE
INTRAMUSCULAR | Status: DC | PRN
  Administered 2019-03-24: 60 mg via INTRAVENOUS

## 2019-03-24 MED ORDER — ONDANSETRON HCL 4 MG PO TABS: 4.0000 mg | ORAL_TABLET | Freq: Four times a day (QID) | ORAL | Status: DC | PRN

## 2019-03-24 MED ORDER — FENTANYL CITRATE (PF) 100 MCG/2ML IJ SOLN
INTRAMUSCULAR | Status: DC | PRN
  Administered 2019-03-24: 100 ug via INTRAVENOUS
  Administered 2019-03-24 (×3): 50 ug via INTRAVENOUS

## 2019-03-24 MED ORDER — ONDANSETRON HCL 4 MG/2ML IJ SOLN
INTRAMUSCULAR | Status: DC | PRN
  Administered 2019-03-24: 4 mg via INTRAVENOUS

## 2019-03-24 MED ORDER — ACETAMINOPHEN 500 MG PO TABS
1000.0000 mg | ORAL_TABLET | Freq: Four times a day (QID) | ORAL | Status: DC
  Administered 2019-03-24 – 2019-03-25 (×3): 1000 mg via ORAL
  Filled 2019-03-24 (×3): qty 2

## 2019-03-24 MED ORDER — OXYCODONE HCL 5 MG PO TABS: 5.0000 mg | ORAL_TABLET | Freq: Once | ORAL | Status: DC | PRN

## 2019-03-24 MED ORDER — EPHEDRINE SULFATE 50 MG/ML IJ SOLN
INTRAMUSCULAR | Status: DC | PRN
  Administered 2019-03-24: 10 mg via INTRAVENOUS
  Administered 2019-03-24: 5 mg via INTRAVENOUS

## 2019-03-24 MED ORDER — SODIUM CHLORIDE 0.9 % IR SOLN
Status: DC | PRN
  Administered 2019-03-24: 1000 mL

## 2019-03-24 MED ORDER — OXYCODONE HCL 5 MG PO TABS
10.0000 mg | ORAL_TABLET | ORAL | Status: DC | PRN
  Administered 2019-03-25 (×3): 15 mg via ORAL
  Filled 2019-03-24 (×3): qty 3

## 2019-03-24 MED ORDER — DEXAMETHASONE SODIUM PHOSPHATE 10 MG/ML IJ SOLN
INTRAMUSCULAR | Status: DC | PRN
  Administered 2019-03-24: 5 mg via INTRAVENOUS

## 2019-03-24 MED ORDER — DEXTROSE 5 % IV SOLN: 3.0000 g | INTRAVENOUS | Status: DC

## 2019-03-24 SURGICAL SUPPLY — 63 items
BANDAGE ESMARK 6X9 LF (GAUZE/BANDAGES/DRESSINGS) ×1 IMPLANT
BAR EXFX 400X11 NS LF (EXFIX) ×2
BAR GLASS FIBER EXFX 11X400 (EXFIX) ×6 IMPLANT
BNDG CMPR 9X6 STRL LF SNTH (GAUZE/BANDAGES/DRESSINGS) ×1
BNDG COHESIVE 6X5 TAN STRL LF (GAUZE/BANDAGES/DRESSINGS) ×3 IMPLANT
BNDG ELASTIC 4X5.8 VLCR STR LF (GAUZE/BANDAGES/DRESSINGS) ×3 IMPLANT
BNDG ELASTIC 6X5.8 VLCR STR LF (GAUZE/BANDAGES/DRESSINGS) ×3 IMPLANT
BNDG ESMARK 6X9 LF (GAUZE/BANDAGES/DRESSINGS) ×3
BNDG GAUZE ELAST 4 BULKY (GAUZE/BANDAGES/DRESSINGS) ×3 IMPLANT
CLEANER TIP ELECTROSURG 2X2 (MISCELLANEOUS) ×3 IMPLANT
COVER SURGICAL LIGHT HANDLE (MISCELLANEOUS) ×3 IMPLANT
COVER WAND RF STERILE (DRAPES) ×3 IMPLANT
CUFF TOURN SGL QUICK 18X4 (TOURNIQUET CUFF) IMPLANT
CUFF TOURN SGL QUICK 24 (TOURNIQUET CUFF)
CUFF TOURN SGL QUICK 34 (TOURNIQUET CUFF)
CUFF TRNQT CYL 24X4X16.5-23 (TOURNIQUET CUFF) IMPLANT
CUFF TRNQT CYL 34X4.125X (TOURNIQUET CUFF) IMPLANT
DRAPE C-ARM 42X72 X-RAY (DRAPES) ×3 IMPLANT
DRAPE U-SHAPE 47X51 STRL (DRAPES) ×3 IMPLANT
DRSG ADAPTIC 3X8 NADH LF (GAUZE/BANDAGES/DRESSINGS) ×3 IMPLANT
ELECT REM PT RETURN 9FT ADLT (ELECTROSURGICAL) ×3
ELECTRODE REM PT RTRN 9FT ADLT (ELECTROSURGICAL) ×1 IMPLANT
EVACUATOR 1/8 PVC DRAIN (DRAIN) IMPLANT
GAUZE SPONGE 4X4 12PLY STRL (GAUZE/BANDAGES/DRESSINGS) ×3 IMPLANT
GAUZE XEROFORM 5X9 LF (GAUZE/BANDAGES/DRESSINGS) ×2 IMPLANT
GLOVE BIOGEL PI ORTHO PRO SZ8 (GLOVE) ×4
GLOVE ORTHO TXT STRL SZ7.5 (GLOVE) ×6 IMPLANT
GLOVE PI ORTHO PRO STRL SZ8 (GLOVE) ×2 IMPLANT
GLOVE SURG ORTHO 8.0 STRL STRW (GLOVE) ×6 IMPLANT
GOWN STRL REUS W/ TWL XL LVL3 (GOWN DISPOSABLE) ×1 IMPLANT
GOWN STRL REUS W/TWL 2XL LVL3 (GOWN DISPOSABLE) ×3 IMPLANT
GOWN STRL REUS W/TWL XL LVL3 (GOWN DISPOSABLE) ×3
HANDPIECE INTERPULSE COAX TIP (DISPOSABLE)
KIT BASIN OR (CUSTOM PROCEDURE TRAY) ×3 IMPLANT
KIT TURNOVER KIT B (KITS) ×3 IMPLANT
MANIFOLD NEPTUNE II (INSTRUMENTS) ×1 IMPLANT
NEEDLE 22X1 1/2 (OR ONLY) (NEEDLE) IMPLANT
NS IRRIG 1000ML POUR BTL (IV SOLUTION) ×3 IMPLANT
PACK ORTHO EXTREMITY (CUSTOM PROCEDURE TRAY) ×3 IMPLANT
PAD ARMBOARD 7.5X6 YLW CONV (MISCELLANEOUS) ×6 IMPLANT
PADDING CAST COTTON 6X4 STRL (CAST SUPPLIES) ×9 IMPLANT
PIN CLAMP 2BAR 75MM BLUE (EXFIX) ×3 IMPLANT
PIN HALF YELLOW 5X160X35 (EXFIX) ×6 IMPLANT
PIN TRANSFIXING 5.0 (EXFIX) ×3 IMPLANT
SET HNDPC FAN SPRY TIP SCT (DISPOSABLE) IMPLANT
SPONGE LAP 18X18 RF (DISPOSABLE) ×3 IMPLANT
STAPLER VISISTAT 35W (STAPLE) IMPLANT
STOCKINETTE IMPERVIOUS LG (DRAPES) ×3 IMPLANT
SUCTION FRAZIER HANDLE 10FR (MISCELLANEOUS) ×2
SUCTION TUBE FRAZIER 10FR DISP (MISCELLANEOUS) ×1 IMPLANT
SUT ETHILON 3 0 PS 1 (SUTURE) IMPLANT
SUT VIC AB 0 CT1 27 (SUTURE)
SUT VIC AB 0 CT1 27XBRD ANBCTR (SUTURE) IMPLANT
SUT VIC AB 2-0 CT1 27 (SUTURE)
SUT VIC AB 2-0 CT1 TAPERPNT 27 (SUTURE) ×2 IMPLANT
SYR CONTROL 10ML LL (SYRINGE) IMPLANT
TOWEL GREEN STERILE (TOWEL DISPOSABLE) ×4 IMPLANT
TOWEL GREEN STERILE FF (TOWEL DISPOSABLE) ×6 IMPLANT
TUBE CONNECTING 12'X1/4 (SUCTIONS) ×1
TUBE CONNECTING 12X1/4 (SUCTIONS) ×2 IMPLANT
UNDERPAD 30X30 (UNDERPADS AND DIAPERS) ×3 IMPLANT
WATER STERILE IRR 1000ML POUR (IV SOLUTION) ×6 IMPLANT
YANKAUER SUCT BULB TIP NO VENT (SUCTIONS) ×3 IMPLANT

## 2019-03-24 NOTE — Op Note (Signed)
03/24/2019  5:41 PM  PATIENT:  [name] DIAGNOSIS: Left trimalleolar ankle fracture dislocation  POST-OPERATIVE DIAGNOSIS:  Same  PROCEDURE:    Application of multiplanar external fixator, left leg  SURGEON:  Johnny Bridge, MD  PHYSICIAN ASSISTANT: Joya Gaskins, OPA-C, present and scrubbed throughout the case, critical for completion in a timely fashion, and for retraction, instrumentation, and closure.  ANESTHESIA:   General  PREOPERATIVE INDICATIONS:  Antonio Davis is a  43 y.o. male with a diagnosis of fractured left leg whoelected for surgical management.  He is on chronic Coumadin, and had a dislocation that was close reduced in the emergency room, but required external fixator placement for provisional maintenance of length in anticipation of significant soft tissue swelling prior to definitive internal fixation.  The risks benefits and alternatives were discussed with the patient preoperatively including but not limited to the risks of infection, bleeding, nerve injury, cardiopulmonary complications, the need for revision surgery, among others, and the patient was willing to proceed.  We also discussed the fact that he will need definitive internal fixation on a delayed basis.  ESTIMATED BLOOD LOSS: Minimal  OPERATIVE IMPLANTS: Biomet multiplanar external fixator  OPERATIVE FINDINGS: Extremely unstable trimalleolar ankle fracture dislocation  OPERATIVE PROCEDURE: The patient was brought to the operating room and placed in the supine position.  General anesthesia was administered.  IV antibiotics were given with 3 g of Ancef.  The left lower extremity was pre-scrubbed, given the muddy contaminated appearance of his skin.  After the pre-scrub, the left lower extremity was prepped and draped in usual sterile fashion.  Timeout performed.  2 pins were placed in the tibia proximally in the anterior posterior plane.  1 pin was placed across the calcaneus in  the coronal plane.  1 bar was placed on either side of the tibia, both medial and lateral, providing a multiplanar A-frame type fixation.  C-arm was used to confirm appropriate tension and length on the fibula, as well as position of the tibiotalar joint, and the frame was tightened.  The pin sites were dressed with sterile gauze and a light compressive wrap.  He was awakened and returned to the PACU in stable and satisfactory condition.  He will plan to be admitted overnight, for physical therapy, elevation, CT scan, and monitoring of soft tissue swelling.

## 2019-03-24 NOTE — Progress Notes (Signed)
Preprocedure diagnosis: Left ankle fracture dislocation  Postprocedure diagnosis: Same  Procedure: Left tibiotalar closed reduction  Attending physician: Marchia Bond, MD  First assistant: Angelena Sole, OPA-C  Procedure details: After informed written consent was obtained, conscious sedation performed by the emergency room provider, and close reduction performed to the tibial talar joint.  The fracture was extremely unstable.  Satisfactory reduction clinically achieved although it was fairly difficult to hold.  Postreduction x-rays are pending.  We will plan for application of external fixator once his COVID test has been achieved, now that the skin is no longer tented.  Johnny Bridge, MD

## 2019-03-24 NOTE — ED Provider Notes (Signed)
.  Sedation  Date/Time: 03/24/2019 2:10 PM Performed by: Sharlett Iles, MD Authorized by: Sharlett Iles, MD   Consent:    Consent obtained:  Written   Consent given by:  Patient   Risks discussed:  Allergic reaction, inadequate sedation, respiratory compromise necessitating ventilatory assistance and intubation, prolonged sedation necessitating reversal and prolonged hypoxia resulting in organ damage Universal protocol:    Immediately prior to procedure a time out was called: yes   Indications:    Procedure performed:  Fracture reduction   Procedure necessitating sedation performed by:  Different physician Pre-sedation assessment:    Time since last food or drink:  12 hours   ASA classification: class 2 - patient with mild systemic disease     Neck mobility: normal     Mouth opening:  3 or more finger widths   Mallampati score:  III - soft palate, base of uvula visible   Pre-sedation assessments completed and reviewed: airway patency, hydration status, mental status, pain level, respiratory function and temperature   Immediate pre-procedure details:    Reassessment: Patient reassessed immediately prior to procedure     Reviewed: vital signs, relevant labs/tests and NPO status     Verified: bag valve mask available, emergency equipment available, intubation equipment available, IV patency confirmed, oxygen available and suction available   Procedure details (see MAR for exact dosages):    Preoxygenation:  Nasal cannula   Sedation:  Ketamine and propofol   Intra-procedure monitoring:  Blood pressure monitoring, cardiac monitor, continuous pulse oximetry, continuous capnometry, frequent LOC assessments and frequent vital sign checks   Intra-procedure events: none     Total Provider sedation time (minutes):  15 Post-procedure details:    Attendance: Constant attendance by certified staff until patient recovered     Recovery: Patient returned to pre-procedure baseline    Post-sedation assessments completed and reviewed: airway patency, cardiovascular function, mental status, pain level and respiratory function     Patient is stable for discharge or admission: yes     Patient tolerance:  Tolerated well, no immediate complications      Little, Wenda Overland, MD 03/24/19 [phone]

## 2019-03-24 NOTE — Sedation Documentation (Addendum)
Pts extremity reduced back into place by MD Mardelle Matte

## 2019-03-24 NOTE — ED Provider Notes (Signed)
[place] [facility] EMERGENCY DEPARTMENT Provider Note   CSN: [id number] Arrival date & time: 03/24/19  1057     History   Chief Complaint Chief Complaint  Patient presents with  . Ankle Injury    HPI Antonio Davis is a 43 y.o. male.     The history is provided by the patient. No language interpreter was used.  Ankle Injury     43 year old male presenting for evaluation of left ankle injury.  Patient report prior to arrival he was cleaning his pool when he slipped, and fell backward injuring his left ankle.  He felt a pop follows with significant onset of acute pain about the ankle.  Pain is sharp, throbbing, 10 out of 10, worse with movement.  He is unable to bear weight and has noticed increasing swelling to the ankle.  No significant pain to his knee or hip.  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  He denies any associated numbness.  Denies any precipitating symptoms prior to the fall.  Past Medical History:  Diagnosis Date  . History of DVT (deep vein thrombosis)    R calf; 2010; on Coumadin since  . IBS (irritable bowel syndrome)     Patient Active Problem List   Diagnosis Date Noted  . Encounter for therapeutic drug monitoring 04/12/2017  . DVT (deep venous thrombosis) ([place]) 04/12/2017  . Hypercoagulable state ([place]) 04/12/2017  . History of blood clots 03/15/2017  . IBS (irritable bowel syndrome) 03/15/2017  . History of Coumadin therapy 03/15/2017  . Screening cholesterol level 03/15/2017  . Screening for diabetes mellitus 03/15/2017  . Healthcare maintenance 03/15/2017    Past Surgical History:  Procedure Laterality Date  . ANKLE SURGERY    . ANTERIOR CRUCIATE LIGAMENT REPAIR Bilateral   . KNEE ARTHROSCOPY W/ MENISCAL REPAIR    . SHOULDER SURGERY     2/2 fracture  . VASECTOMY          Home Medications    Prior to Admission medications   Medication Sig Start Date End Date Taking? Authorizing Provider  cephALEXin  (KEFLEX) 500 MG capsule Take 1 capsule (500 mg total) by mouth 3 (three) times daily. 07/19/18   Evelina Bucy, DPM  cyclobenzaprine (FLEXERIL) 10 MG tablet Take 0.5 tablets (5 mg total) by mouth 3 (three) times daily as needed for muscle spasms. 06/27/18   Evelina Bucy, DPM  diclofenac sodium (VOLTAREN) 1 % GEL Apply 2 g topically 4 (four) times daily. 02/06/18   Evelina Bucy, DPM  HYDROcodone-acetaminophen (NORCO) 5-325 MG tablet Take 1 tablet by mouth every 6 (six) hours as needed for moderate pain. 07/25/18   Evelina Bucy, DPM  NONFORMULARY OR COMPOUNDED ITEM [facility]:  Peripheral Neuropathy Cream + Scar Cream - Bupivacaine 1%, Doxepin 3%, Gabapentin 6%, Topiramate 1%, Verapamil 10%, Pentoxifylline 5%, apply 1-2 grams to affected area 3-4 times daily. 09/12/18   Evelina Bucy, DPM  ondansetron (ZOFRAN) 4 MG tablet Take 1 tablet (4 mg total) by mouth every 8 (eight) hours as needed for nausea or vomiting. 05/31/18   Evelina Bucy, DPM  warfarin (COUMADIN) 5 MG tablet 10 mg MWF, sat and 5 mg Tue, Thurs and Sun 11/02/18   Trinna Post, PA-C    Family History Family History  Problem Relation Age of Onset  . Clotting disorder Father   . Clotting disorder Brother     Social History Social History   Tobacco Use  .  Smoking status: Never Smoker  . Smokeless tobacco: Never Used  Substance Use Topics  . Alcohol use: No  . Drug use: No     Allergies   Talwin [pentazocine]   Review of Systems Review of Systems  Constitutional: Negative for fever.  Musculoskeletal: Positive for joint swelling.  Neurological: Negative for numbness.     Physical Exam Updated Vital Signs BP (!) 111/58 (BP Location: Right Arm)   Pulse 60   Temp 98.1 F (36.7 C) (Oral)   Resp 18   SpO2 97%   Physical Exam Vitals signs and nursing note reviewed.  Constitutional:      General: He is not in acute distress.    Appearance: He is well-developed. He is obese.  HENT:      Head: Atraumatic.  Eyes:     Conjunctiva/sclera: Conjunctivae normal.  Neck:     Musculoskeletal: Neck supple.  Musculoskeletal:        General: Signs of injury (L ankle: moderate swelling noted to medial malleolar region with decreased range of motion secondary to pain.  Edema noted to the dorsum of feet, pedal pulse palpable.  No significant tenderness to fifth metatarsal region.  Exam is limited due to pain.) present.     Comments: Left knee nontender.  Skin:    Findings: No rash.  Neurological:     Mental Status: He is alert.      ED Treatments / Results  Labs (all labs ordered are listed, but only abnormal results are displayed) Labs Reviewed  PROTIME-INR - Abnormal; Notable for the following components:      Result Value   Prothrombin Time 22.7 (*)    INR 2.0 (*)    All other components within normal limits  SARS CORONAVIRUS 2 (HOSPITAL ORDER, [facility])  SARS CORONAVIRUS 2 (TAT 6-24 HRS)    EKG None  Radiology Dg Ankle Complete Left  Result Date: 03/24/2019 CLINICAL DATA:  Slipped and fell with ankle pain and deformity. EXAM: LEFT ANKLE COMPLETE - 3+ VIEW COMPARISON:  None. FINDINGS: Severely comminuted fracture of the distal fibular diaphysis is associated with medial malleolar and posterior lip fractures of the distal tibia. Posterior and lateral dislocation of the talus relative to the tibia is noted. IMPRESSION: Trimalleolar fracture/dislocation of the left ankle with severely comminuted distal fibular component. Electronically Signed   By: Misty Stanley M.D.   On: 03/24/2019 12:29   Dg Foot Complete Left  Result Date: 03/24/2019 CLINICAL DATA:  Slip and fall.  Ankle pain and deformity. EXAM: LEFT FOOT - COMPLETE 3+ VIEW COMPARISON:  None. FINDINGS: Better demonstrated on the ankle films performed at the same time, the patient has a trimalleolar fracture dislocation. No evidence for associated fracture involving the bony anatomy of the  foot. Hallux valgus deformity noted with some mild degenerative changes in the MTP joint of the great toe. IMPRESSION: Trimalleolar fracture dislocation at the ankle. See ankle report dictated separately. 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 Electronically Signed   By: Misty Stanley M.D.   On: 03/24/2019 12:30    Procedures Procedures (including critical care time)  Medications Ordered in ED Medications  propofol (DIPRIVAN) 10 mg/mL bolus/IV push 80 mg (80 mg Intravenous See Procedure Record 03/24/19 1409)  propofol (DIPRIVAN) 10 mg/mL bolus/IV push (20 mg Intravenous Given 03/24/19 1359)  HYDROmorphone (DILAUDID) injection 1 mg (1 mg Intravenous Given 03/24/19 1152)  ondansetron (ZOFRAN) injection 4 mg (  4 mg Intravenous Given 03/24/19 1152)  ketamine 50 mg in normal saline 5 mL (10 mg/mL) syringe (70 mg Intravenous Given 03/24/19 1357)     Initial Impression / Assessment and Plan / ED Course  I have reviewed the triage vital signs and the nursing notes.  Pertinent labs & imaging results that were available during my care of the patient were reviewed by me and considered in my medical decision making (see chart for details).        BP (!) 111/58 (BP Location: Right Arm)   Pulse 60   Temp 98.1 F (36.7 C) (Oral)   Resp 18   SpO2 97%    Final Clinical Impressions(s) / ED Diagnoses   Final diagnoses:  Fracture of ankle, trimalleolar, left, closed, initial encounter    ED Discharge Orders    None     11:47 AM Patient slipped and fell had a mechanical injury to his left ankle.  This is a closed injury, suspect fracture dislocation causing significant swelling to his joint.  He is also on Coumadin which increased risk of hemarthrosis.  Will provide pain medication and obtain appropriate x-rays will manage appropriately.  Will apply ice and the patient n.p.o.  1:27 PM X-ray of the left ankle demonstrate a trimalleolar  fracture/dislocation of the left ankle with severely comminuted distal fibular component.  This is a closed injury.  I immediately consulted on-call orthopedist, Dr. Mardelle Matte who has reviewed x-ray and plan to perform ORIF today.  However, patient would benefit from reduction of his ankle.  Dr. Mardelle Matte will visit patient in the ER, will perform conscious sedation and reduce.  2:41 PM Dr. Mardelle Matte have reduced left ankle in the ER.  Patient will be going to the OR for surgical reduction of his injury.  Care discussed with Dr. Rex Kras.  Antonio Davis was evaluated in Emergency Department on 03/24/2019 for the symptoms described in the history of present illness. He was evaluated in the context of the global COVID-19 pandemic, which necessitated consideration that the patient might be at risk for infection with the SARS-CoV-2 virus that causes COVID-19. Institutional protocols and algorithms that pertain to the evaluation of patients at risk for COVID-19 are in a state of rapid change based on information released by regulatory bodies including the CDC and federal and state organizations. These policies and algorithms were followed during the patient's care in the ED.    Domenic Moras, PA-C 03/24/19 1445    Little, Wenda Overland, MD 03/27/19 [phone]

## 2019-03-24 NOTE — Anesthesia Procedure Notes (Signed)
Procedure Name: Intubation Date/Time: 03/24/2019 5:03 PM Performed by: Suzy Bouchard, CRNA Pre-anesthesia Checklist: Patient identified, Emergency Drugs available, Suction available, Patient being monitored and Timeout performed Patient Re-evaluated:Patient Re-evaluated prior to induction Oxygen Delivery Method: Circle system utilized Preoxygenation: Pre-oxygenation with 100% oxygen Induction Type: IV induction and Rapid sequence Ventilation: Mask ventilation without difficulty Laryngoscope Size: Glidescope and 4 Grade View: Grade I Tube type: Oral Tube size: 7.5 mm Number of attempts: 1 Airway Equipment and Method: Stylet and Video-laryngoscopy Placement Confirmation: ETT inserted through vocal cords under direct vision,  positive ETCO2 and breath sounds checked- equal and bilateral Secured at: 23 cm Tube secured with: Tape Dental Injury: Teeth and Oropharynx as per pre-operative assessment

## 2019-03-24 NOTE — Anesthesia Postprocedure Evaluation (Signed)
Anesthesia Post Note  Patient: Antonio Davis  Procedure(s) Performed: EXTERNAL FIXATION LEG (Left Leg Lower)     Patient location during evaluation: PACU Anesthesia Type: General Level of consciousness: awake and alert Pain management: pain level controlled Vital Signs Assessment: post-procedure vital signs reviewed and stable Respiratory status: spontaneous breathing, nonlabored ventilation, respiratory function stable and patient connected to nasal cannula oxygen Cardiovascular status: blood pressure returned to baseline and stable Postop Assessment: no apparent nausea or vomiting Anesthetic complications: no    Last Vitals:  Vitals:   03/24/19 1840 03/24/19 1926  BP: 120/74 (!) 146/88  Pulse: 93 84  Resp: 18 16  Temp: 37.4 C 36.8 C  SpO2: 93% 91%    Last Pain:  Vitals:   03/24/19 1926  TempSrc: Oral  PainSc:                  [place]

## 2019-03-24 NOTE — Discharge Instructions (Signed)
Diet: As you were doing prior to hospitalization   Shower:  May shower but keep the wounds dry, use an occlusive plastic wrap, NO SOAKING IN TUB.  If the bandage gets wet, change with a clean dry gauze.  If you have a splint on, leave the splint in place and keep the splint dry with a plastic bag.  Dressing:  Keep pins clean and dry.  You may change your dressing 3-5 days after surgery.  Activity:  Increase activity slowly as tolerated, but follow the weight bearing instructions below.  The rules on driving is that you can not be taking narcotics while you drive, and you must feel in control of the vehicle.    Weight Bearing:   No weight on left leg.    To prevent constipation: you may use a stool softener such as -  Colace (over the counter) 100 mg by mouth twice a day  Drink plenty of fluids (prune juice may be helpful) and high fiber foods Miralax (over the counter) for constipation as needed.    Itching:  If you experience itching with your medications, try taking only a single pain pill, or even half a pain pill at a time.  You may take up to 10 pain pills per day, and you can also use benadryl over the counter for itching or also to help with sleep.   Precautions:  If you experience chest pain or shortness of breath - call 911 immediately for transfer to the hospital emergency department!!  If you develop a fever greater that 101 F, purulent drainage from wound, increased redness or drainage from wound, or calf pain -- Call the office at [phone]                                                Follow- Up Appointment:  Please call for an appointment to be seen in 2 weeks [place] - [phone]

## 2019-03-24 NOTE — ED Triage Notes (Signed)
Patient slipped and as falling heard left ankle pop. Arrived with swelling and pain to same.

## 2019-03-24 NOTE — ED Notes (Signed)
Patient transported to X-ray 

## 2019-03-24 NOTE — Anesthesia Preprocedure Evaluation (Addendum)
Anesthesia Evaluation  Patient identified by MRN, date of birth, ID band Patient awake    Airway Mallampati: II  TM Distance: >3 FB Neck ROM: Full    Dental  (+) Teeth Intact, Dental Advisory Given   Pulmonary neg pulmonary ROS,    breath sounds clear to auscultation       Cardiovascular + DVT   Rhythm:regular Rate:Normal  Takes coumadin   Neuro/Psych    GI/Hepatic   Endo/Other  Morbid obesity  Renal/GU      Musculoskeletal   Abdominal   Peds  Hematology   Anesthesia Other Findings Hx DVT x2 on Coumadin (last dose last night)  Reproductive/Obstetrics                            Anesthesia Physical Anesthesia Plan  ASA: II  Anesthesia Plan: General   Post-op Pain Management:    Induction: Intravenous  PONV Risk Score and Plan: 2 and Ondansetron, Dexamethasone, Midazolam and Treatment may vary due to age or medical condition  Airway Management Planned: Video Laryngoscope Planned  Additional Equipment:   Intra-op Plan:   Post-operative Plan: Extubation in OR  Informed Consent: I have reviewed the patients History and Physical, chart, labs and discussed the procedure including the risks, benefits and alternatives for the proposed anesthesia with the patient or authorized representative who has indicated his/her understanding and acceptance.       Plan Discussed with: CRNA, Anesthesiologist and Surgeon  Anesthesia Plan Comments:         Anesthesia Quick Evaluation

## 2019-03-24 NOTE — Progress Notes (Signed)
The patient is arousable and following commands at this time with a ETCO2 at 36

## 2019-03-24 NOTE — Progress Notes (Addendum)
Orthopedic Tech Progress Note Patient Details:  Antonio Davis [birth year] [id number] Dr and Pa did an ankle reduction to patient I just got all the stuff together and passed it when it was time  Ortho Devices Type of Ortho Device: Stirrup splint, Short leg splint Ortho Device/Splint Location: LLE Ortho Device/Splint Interventions: Other (comment)   Post Interventions Patient Tolerated: Other (comment) Instructions Provided: Other (comment)   Janit Pagan 03/24/2019, 2:09 PM

## 2019-03-24 NOTE — Transfer of Care (Signed)
Immediate Anesthesia Transfer of Care Note  Patient: Antonio Davis  Procedure(s) Performed: EXTERNAL FIXATION LEG (Left Leg Lower)  Patient Location: PACU  Anesthesia Type:General  Level of Consciousness: awake, alert  and oriented  Airway & Oxygen Therapy: Patient Spontanous Breathing and Patient connected to face mask oxygen  Post-op Assessment: Report given to RN, Post -op Vital signs reviewed and stable and Patient moving all extremities X 4  Post vital signs: Reviewed and stable  Last Vitals:  Vitals Value Taken Time  BP    Temp    Pulse    Resp    SpO2      Last Pain:  Vitals:   03/24/19 1435  TempSrc:   PainSc: 3          Complications: No apparent anesthesia complications

## 2019-03-24 NOTE — Progress Notes (Signed)
ANTICOAGULATION CONSULT NOTE - Initial Consult  Pharmacy Consult for Warfarin  Indication:     VTE prophylaxis,  H/o DVT  (on chronic Warfarin PTA)  Allergies  Allergen Reactions  . Talwin [Pentazocine] Other (See Comments)    Patient Measurements: Height: 6\' 1"  (185.4 cm) Weight: (!) 350 lb (158.8 kg) IBW/kg (Calculated) : 79.9 Heparin Dosing Weight: 117.6 kg  Vital Signs: Temp: 99.3 F (37.4 C) (09/12 1840) Temp Source: Oral (09/12 1840) BP: 120/74 (09/12 1840) Pulse Rate: 93 (09/12 1840)  Labs: Recent Labs    03/24/19 1153  LABPROT 22.7*  INR 2.0*    CrCl cannot be calculated (Patient's most recent lab result is older than the maximum 21 days allowed.).   Medical History: Past Medical History:  Diagnosis Date  . History of DVT (deep vein thrombosis)    R calf; 2010; on Coumadin since  . IBS (irritable bowel syndrome)     Medications:  Medications Prior to Admission  Medication Sig Dispense Refill Last Dose  . cephALEXin (KEFLEX) 500 MG capsule Take 1 capsule (500 mg total) by mouth 3 (three) times daily. 21 capsule 0   . cyclobenzaprine (FLEXERIL) 10 MG tablet Take 0.5 tablets (5 mg total) by mouth 3 (three) times daily as needed for muscle spasms. 30 tablet 0   . diclofenac sodium (VOLTAREN) 1 % GEL Apply 2 g topically 4 (four) times daily. 100 g 1   . HYDROcodone-acetaminophen (NORCO) 5-325 MG tablet Take 1 tablet by mouth every 6 (six) hours as needed for moderate pain. 20 tablet 0   . NONFORMULARY OR COMPOUNDED ITEM [facility]:  Peripheral Neuropathy Cream + Scar Cream - Bupivacaine 1%, Doxepin 3%, Gabapentin 6%, Topiramate 1%, Verapamil 10%, Pentoxifylline 5%, apply 1-2 grams to affected area 3-4 times daily. 100 each 5   . ondansetron (ZOFRAN) 4 MG tablet Take 1 tablet (4 mg total) by mouth every 8 (eight) hours as needed for nausea or vomiting. 20 tablet 0   . warfarin (COUMADIN) 5 MG tablet 10 mg MWF, sat and 5 mg Tue, Thurs and Sun 140 tablet 5      Assessment: 43 y.o male 03/24/19 with left ankle fracture after he twisted his ankle on a railroad tie on 03/24/19.  Admission INR on today,9/12 is therapeutic at 2.0.  Now s/p left leg/ankle external fixator 03/24/19. Pharmacy consulted to dose warfarin for VTE prophylaxis.   Warfarin PTA dose: 10 mg qMWFSat and 5 mg qTTSun, LD taken 9/11 @ 21:00.  Goal of Therapy:  INR 2-3 Monitor platelets by anticoagulation protocol: Yes   Plan:  Warfarin 10 mg po tonight as per home med regimen. Daily Protime/INR,  If INR stable x 3 days may reduce INR checks.   Thank you for allowing pharmacy to be part of this patients care team. Nicole Cella, RPh Clinical Pharmacist Please check AMION for all [place] phone numbers After 10:00 PM, call [place] [phone] 03/24/2019,7:04 PM

## 2019-03-24 NOTE — H&P (Signed)
PREOPERATIVE H&P  Chief Complaint: Left ankle pain  HPI: Antonio Davis is a 43 y.o. male who twisted his ankle on a railroad tie today, he was wearing flip-flops, acute onset severe pain, deformity, injury happened at 10 AM.  Unable to ambulate.  Came to [facility] emergency room.  Orthopedic consultation requested.  Patient has had a contralateral Achilles repair, bilateral ACL reconstructions, the ACL reconstructions were done by Dr. Margarette Asal.  He has had 2 previous blood clots, and is on lifelong Coumadin.   Past Medical History:  Diagnosis Date  . History of DVT (deep vein thrombosis)    R calf; 2010; on Coumadin since  . IBS (irritable bowel syndrome)    Past Surgical History:  Procedure Laterality Date  . ANKLE SURGERY    . ANTERIOR CRUCIATE LIGAMENT REPAIR Bilateral   . KNEE ARTHROSCOPY W/ MENISCAL REPAIR    . SHOULDER SURGERY     2/2 fracture  . VASECTOMY     Social History   Socioeconomic History  . Marital status: Married    Spouse name: Not on file  . Number of children: Not on file  . Years of education: Not on file  . Highest education level: Not on file  Occupational History  . Not on file  Social Needs  . Financial resource strain: Not on file  . Food insecurity    Worry: Not on file    Inability: Not on file  . Transportation needs    Medical: Not on file    Non-medical: Not on file  Tobacco Use  . Smoking status: Never Smoker  . Smokeless tobacco: Never Used  Substance and Sexual Activity  . Alcohol use: No  . Drug use: No  . Sexual activity: Not on file  Lifestyle  . Physical activity    Days per week: Not on file    Minutes per session: Not on file  . Stress: Not on file  Relationships  . Social Herbalist on phone: Not on file    Gets together: Not on file    Attends religious service: Not on file    Active member of club or organization: Not on file    Attends meetings of clubs or organizations: Not on file    Relationship  status: Not on file  Other Topics Concern  . Not on file  Social History Narrative   Works for [employer], maintenance   Married.  In process of adopting child.   Family History  Problem Relation Age of Onset  . Clotting disorder Father   . Clotting disorder Brother    Allergies  Allergen Reactions  . Talwin [Pentazocine] Other (See Comments)   Prior to Admission medications   Medication Sig Start Date End Date Taking? Authorizing Provider  cephALEXin (KEFLEX) 500 MG capsule Take 1 capsule (500 mg total) by mouth 3 (three) times daily. 07/19/18   Evelina Bucy, DPM  cyclobenzaprine (FLEXERIL) 10 MG tablet Take 0.5 tablets (5 mg total) by mouth 3 (three) times daily as needed for muscle spasms. 06/27/18   Evelina Bucy, DPM  diclofenac sodium (VOLTAREN) 1 % GEL Apply 2 g topically 4 (four) times daily. 02/06/18   Evelina Bucy, DPM  HYDROcodone-acetaminophen (NORCO) 5-325 MG tablet Take 1 tablet by mouth every 6 (six) hours as needed for moderate pain. 07/25/18   Evelina Bucy, DPM  NONFORMULARY OR COMPOUNDED ITEM  Apothe[facility]:  Peripheral Neuropathy Cream + Scar Cream -  Bupivacaine 1%, Doxepin 3%, Gabapentin 6%, Topiramate 1%, Verapamil 10%, Pentoxifylline 5%, apply 1-2 grams to affected area 3-4 times daily. 09/12/18   Evelina Bucy, DPM  ondansetron (ZOFRAN) 4 MG tablet Take 1 tablet (4 mg total) by mouth every 8 (eight) hours as needed for nausea or vomiting. 05/31/18   Evelina Bucy, DPM  warfarin (COUMADIN) 5 MG tablet 10 mg MWF, sat and 5 mg Tue, Thurs and Sun 11/02/18   Trinna Post, PA-C     Positive ROS: All other systems have been reviewed and were otherwise negative with the exception of those mentioned in the HPI and as above.  Physical Exam: BP (!) 111/58 (BP Location: Right Arm)   Pulse 60   Temp 98.1 F (36.7 C) (Oral)   Resp 18   Ht 6\' 1"  (1.854 m)   Wt (!) 158.8 kg   SpO2 97%   BMI 46.18 kg/m   General: Alert, no acute  distress Cardiovascular: Moderate left-sided edema around the ankle with soft tissue swelling, minimal edema in his pretibial areas bilaterally Respiratory: No cyanosis, no use of accessory musculature GI: No organomegaly, abdomen is soft and non-tender Skin: No lesions in the area of chief complaint with the exception of bruising, no evidence for open fracture Neurologic: Sensation intact distally Psychiatric: Patient is competent for consent with normal mood and affect Lymphatic: No axillary or cervical lymphadenopathy  MUSCULOSKELETAL: Left ankle has gross deformity, EHL and FHL are intact, skin intact.  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  I do not want to risk his skin, so we will proceed with emergent closed reduction despite not having a cover test back yet.  The risks benefits and alternatives were discussed with the patient including but not limited to the risks of nonoperative treatment, versus surgical intervention including infection, bleeding, nerve injury,  blood clots, cardiopulmonary complications, morbidity, mortality, among others, and they were willing to proceed.   He will need multiple operations, including the external fixator, and then likely a staged open reduction internal fixation, plan to refer to Dr. Lucia Gaskins for definitive management.  He may be able to be discharged home later today after his external fixator.  Johnny Bridge, MD Cell [phone]   03/24/2019 1:43 PM

## 2019-03-24 NOTE — Progress Notes (Signed)
Patient arrived to room 5N19 via stretcher from PACU.  External fixator intact LLE and leg elevated on pillows.  Pulses positive left fem and cap. Refill < 3 seconds left toes.  Full sensation noted.  Patient states is having pain and medicated with Dilaudid as ordered.  Shortly thereafter, became nauseated and vomited clear fluid.  Taking ice chips for now.  Patient introduced to staff and to unit routine.  Safety protocols shared as well as how to utilize call bell system.  Bed placed in low position with brakes and bed alarm system on.  Patient able to contact wife and update her on his status.  Visiting hours reviewed.

## 2019-03-25 ENCOUNTER — Other Ambulatory Visit: Payer: Self-pay

## 2019-03-25 ENCOUNTER — Encounter (HOSPITAL_COMMUNITY): Payer: Self-pay | Admitting: Orthopedic Surgery

## 2019-03-25 LAB — PROTIME-INR
INR: 2.2 — ABNORMAL HIGH (ref 0.8–1.2)
Prothrombin Time: 23.9 seconds — ABNORMAL HIGH (ref 11.4–15.2)

## 2019-03-25 MED ORDER — OXYCODONE HCL 5 MG PO TABS: 5.0000 mg | ORAL_TABLET | ORAL | 0 refills | Status: DC | PRN

## 2019-03-25 MED ORDER — WARFARIN SODIUM 5 MG PO TABS: 5.0000 mg | ORAL_TABLET | Freq: Once | ORAL | Status: DC

## 2019-03-25 MED ORDER — SENNA-DOCUSATE SODIUM 8.6-50 MG PO TABS: 2.0000 | ORAL_TABLET | Freq: Every day | ORAL | 1 refills | Status: AC

## 2019-03-25 NOTE — Plan of Care (Signed)
  Problem: Clinical Measurements: Goal: Ability to maintain clinical measurements within normal limits will improve Outcome: Progressing   Problem: Activity: Goal: Risk for activity intolerance will decrease Outcome: Progressing   Problem: Coping: Goal: Level of anxiety will decrease Outcome: Progressing   Problem: Pain Managment: Goal: General experience of comfort will improve Outcome: Progressing   Problem: Safety: Goal: Ability to remain free from injury will improve Outcome: Progressing   

## 2019-03-25 NOTE — Progress Notes (Signed)
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 Talwin [Pentazocine] Nausea And Vomiting    Patient Measurements: Height: 6\' 1"  (185.4 cm) Weight: (!) 350 lb (158.8 kg) IBW/kg (Calculated) : 79.9  Vital Signs: Temp: 98.3 F (36.8 C) (09/13 0352) Temp Source: Oral (09/13 0352) BP: 136/73 (09/13 0352) Pulse Rate: 83 (09/13 0352)  Labs: Recent Labs    03/24/19 1153 03/25/19 0422  LABPROT 22.7* 23.9*  INR 2.0* 2.2*    CrCl cannot be calculated (Patient's most recent lab result is older than the maximum 21 days allowed.).   Medical History: Past Medical History:  Diagnosis Date  . History of DVT (deep vein thrombosis)    R calf; 2010; on Coumadin since  . IBS (irritable bowel syndrome)     Medications:  Medications Prior to Admission  Medication Sig Dispense Refill Last Dose  . Multiple Vitamin (MULTIVITAMIN WITH MINERALS) TABS tablet Take 1 tablet by mouth at bedtime.   03/23/2019 at pm  . warfarin (COUMADIN) 5 MG tablet 10 mg MWF, sat and 5 mg Tue, Thurs and Sun (Patient taking differently: Take 5-10 mg by mouth See admin instructions. Take 2 tablets (10 mg) by mouth at bedtime Monday, Wednesday, Friday, Saturday; take 1 tablet (5 mg) at bedtime Sunday, Tuesday, Thursday.) 140 tablet 5 03/23/2019 at 2100  . cyclobenzaprine (FLEXERIL) 10 MG tablet Take 0.5 tablets (5 mg total) by mouth 3 (three) times daily as needed for muscle spasms. (Patient not taking: Reported on 03/24/2019) 30 tablet 0 Not Taking at Unknown time  . diclofenac sodium (VOLTAREN) 1 % GEL Apply 2 g topically 4 (four) times daily. (Patient not taking: Reported on 03/24/2019) 100 g 1 Not Taking at Unknown time  . HYDROcodone-acetaminophen (NORCO) 5-325 MG tablet Take 1 tablet by mouth every 6 (six) hours as needed for moderate pain. (Patient not taking: Reported on 03/24/2019) 20  tablet 0 Not Taking at Unknown time  . NONFORMULARY OR COMPOUNDED ITEM [facility]:  Peripheral Neuropathy Cream + Scar Cream - Bupivacaine 1%, Doxepin 3%, Gabapentin 6%, Topiramate 1%, Verapamil 10%, Pentoxifylline 5%, apply 1-2 grams to affected area 3-4 times daily. (Patient not taking: Reported on 03/24/2019) 100 each 5 Not Taking at Unknown time  . ondansetron (ZOFRAN) 4 MG tablet Take 1 tablet (4 mg total) by mouth every 8 (eight) hours as needed for nausea or vomiting. (Patient not taking: Reported on 03/24/2019) 20 tablet 0 Not Taking at Unknown time    Assessment: 43 y.o male 03/24/19 with left ankle fracture after he twisted his ankle on a railroad tie on 03/24/19. Admission INR on was therapeutic at 2.0.  Now s/p left leg/ankle external fixator 03/24/19. Pharmacy consulted to dose warfarin for VTE prophylaxis. Warfarin PTA dose: 10 mg qMWFSat and 5 mg qTTSun, Last PTA dose taken 9/11 @ 21:00.   Patient's INR today is therapeutic at 2.2 this morning.   Goal of Therapy:  INR 2-3 Monitor platelets by anticoagulation protocol: Yes   Plan:  Warfarin 5mg  po tonight as per home med regimen. Daily Protime/INR, If INR stable x2 days may reduce INR checks.   Thank you for the interesting consult and for involving pharmacy in this patient's care.  Tamela Gammon, PharmD 03/25/2019 7:45 AM PGY-2 Pharmacy Administration Resident Direct Phone: [phone] Please check [email or web address] for unit-specific pharmacist phone numbers

## 2019-03-25 NOTE — Progress Notes (Signed)
Patient discharging home. Discharge instructions explained to patient and he verbalized understanding. Took all personal belongings. No further questions or concerns voiced.  

## 2019-03-25 NOTE — Evaluation (Signed)
Physical Therapy Evaluation Patient Details Name: Antonio Davis MRN: [id number] DOB: 12-Jun-1976 Today's Date: 03/25/2019   History of Present Illness  Pt is a 43 y.o. male who fell sustaining L ankle fx. He underwent external fixation 03/24/19.  Clinical Impression  PT eval complete. Pt required min guard assist transfers and ambulation 10 feet with RW. He was able to maintain NWB LLE. He will have wheelchair at home for his primary mobility. RW needed for bathroom access. Pt/wife educated on stairs in wheelchair. All questions answered. All education complete. Pt/wife decline need for HHPT. He has w/c, BSC and shower seat at home. Plan is for discharge home today. PT signing off.    Follow Up Recommendations No PT follow up;Supervision/Assistance - 24 hour    Equipment Recommendations  Rolling walker with 5" wheels(bariatric)    Recommendations for Other Services       Precautions / Restrictions Precautions Precautions: Fall;Other (comment) Precaution Comments: LLE external fixator Restrictions Weight Bearing Restrictions: Yes LLE Weight Bearing: Non weight bearing      Mobility  Bed Mobility Overal bed mobility: Modified Independent             General bed mobility comments: +rail, increased time  Transfers Overall transfer level: Needs assistance Equipment used: Rolling walker (2 wheeled) Transfers: Sit to/from [facility] Sit to Stand: Min guard Stand pivot transfers: Min guard       General transfer comment: cues for hand placement  Ambulation/Gait Ambulation/Gait assistance: Min guard Gait Distance (Feet): 10 Feet Assistive device: Rolling walker (2 wheeled) Gait Pattern/deviations: Step-to pattern Gait velocity: decreased   General Gait Details: Pt able to maintain NWB LLE.  Stairs Stairs: Yes Stairs assistance: Total assist Stair Management: Wheelchair Number of Stairs: 2 General stair comments: Pt's wife educated on  ascend/descend steps in wheelchair through verbal, demonstration and hands on instruction. Handout provided. She will have 3 people to assist into the house.  Wheelchair Mobility    Modified Rankin (Stroke Patients Only)       Balance Overall balance assessment: Needs assistance Sitting-balance support: No upper extremity supported;Feet supported Sitting balance-Leahy Scale: Good     Standing balance support: Bilateral upper extremity supported;During functional activity Standing balance-Leahy Scale: Poor Standing balance comment: reliant on BUE support due to NWB LLE                             Pertinent Vitals/Pain Pain Assessment: 0-10 Pain Score: 5  Pain Location: LLE Pain Descriptors / Indicators: Grimacing;Guarding Pain Intervention(s): Monitored during session;Repositioned    Home Living Family/patient expects to be discharged to:: Private residence Living Arrangements: Spouse/significant other Available Help at Discharge: Family;Available 24 hours/day Type of Home: House Home Access: Stairs to enter Entrance Stairs-Rails: None Entrance Stairs-Number of Steps: 2 Home Layout: One level Home Equipment: Wheelchair - manual;Shower seat;Bedside commode;Other (comment) Additional Comments: knee scooter    Prior Function Level of Independence: Independent               Hand Dominance        Extremity/Trunk Assessment   Upper Extremity Assessment Upper Extremity Assessment: Overall WFL for tasks assessed    Lower Extremity Assessment Lower Extremity Assessment: LLE deficits/detail LLE Deficits / Details: s/p ankle external fixation       Communication   Communication: No difficulties  Cognition Arousal/Alertness: Awake/alert Behavior During Therapy: WFL for tasks assessed/performed Overall Cognitive Status: Within Functional Limits for tasks assessed  General Comments General  comments (skin integrity, edema, etc.): wife present and engaged during session    Exercises     Assessment/Plan    PT Assessment Patent does not need any further PT services  PT Problem List         PT Treatment Interventions      PT Goals (Current goals can be found in the Care Plan section)  Acute Rehab PT Goals Patient Stated Goal: home PT Goal Formulation: All assessment and education complete, DC therapy    Frequency     Barriers to discharge        Co-evaluation               AM-PAC PT "6 Clicks" Mobility  Outcome Measure Help needed turning from your back to your side while in a flat bed without using bedrails?: None Help needed moving from lying on your back to sitting on the side of a flat bed without using bedrails?: None Help needed moving to and from a bed to a chair (including a wheelchair)?: A Little Help needed standing up from a chair using your arms (e.g., wheelchair or bedside chair)?: A Little Help needed to walk in hospital room?: A Little Help needed climbing 3-5 steps with a railing? : Total 6 Click Score: 18    End of Session Equipment Utilized During Treatment: Gait belt Activity Tolerance: Patient tolerated treatment well Patient left: in chair;with call bell/phone within reach;with family/visitor present Nurse Communication: Mobility status;Weight bearing status PT Visit Diagnosis: Other abnormalities of gait and mobility (R26.89);Pain Pain - Right/Left: Left Pain - part of body: Ankle and joints of foot    Time: [id number] PT Time Calculation (min) (ACUTE ONLY): 34 min   Charges:   PT Evaluation $PT Eval Moderate Complexity: 1 Mod PT Treatments $Therapeutic Activity: 8-22 mins        Lorrin Goodell, PT  Office # [phone] Pager [phone]   Lorriane Shire 03/25/2019, 11:56 AM

## 2019-03-25 NOTE — Care Management (Signed)
Bariatric walker ordered for patient to be delivered to room prior to dc.

## 2019-03-25 NOTE — Discharge Summary (Signed)
Physician Discharge Summary  Patient ID: Antonio Davis MRN: [id number] DOB/AGE: March 31, 1976 43 y.o.  Admit date: 03/24/2019 Discharge date: 03/25/2019  Admission Diagnoses:  Fracture of ankle, trimalleolar, left, closed, initial encounter  Discharge Diagnoses:  Principal Problem:   Fracture of ankle, trimalleolar, left, closed, initial encounter Active Problems:   History of blood clots   Closed fracture dislocation of left ankle   Past Medical History:  Diagnosis Date  . History of DVT (deep vein thrombosis)    R calf; 2010; on Coumadin since  . IBS (irritable bowel syndrome)     Surgeries: Procedure(s): EXTERNAL FIXATION LEG on 03/24/2019   Consultants (if any): Treatment Team:  Marchia Bond, MD  Discharged Condition: Improved  Hospital Course: Antonio Davis is an 43 y.o. male who was admitted 03/24/2019 with a diagnosis of Fracture of ankle, trimalleolar, left, closed, initial encounter and went to the operating room on 03/24/2019 and underwent the above named procedures.    He was given perioperative antibiotics:  Anti-infectives (From admission, onward)   Start     Dose/Rate Route Frequency Ordered Stop   03/25/19 0600  ceFAZolin (ANCEF) 3 g in dextrose 5 % 50 mL IVPB  Status:  Discontinued     3 g 100 mL/hr over 30 Minutes Intravenous On call to O.R. 03/24/19 1602 03/24/19 1604   03/24/19 2000  ceFAZolin (ANCEF) IVPB 1 g/50 mL premix     1 g 100 mL/hr over 30 Minutes Intravenous Every 6 hours 03/24/19 1850 03/25/19 0859   03/24/19 1615  ceFAZolin (ANCEF) 3 g in dextrose 5 % 50 mL IVPB  Status:  Discontinued     3 g 100 mL/hr over 30 Minutes Intravenous To Surgery 03/24/19 1606 03/24/19 1851    .  He was given sequential compression devices, early ambulation, and Coumadin for DVT prophylaxis.  His INR was approximately 2 throughout his hospital stay, and I did not adjust his Coumadin significantly, he will likely stay on his Coumadin until bridging for his  definitive operation with Dr. Lucia Gaskins.  He benefited maximally from the hospital stay and there were no complications.    Recent vital signs:  Vitals:   03/25/19 0352 03/25/19 0819  BP: 136/73 110/64  Pulse: 83 84  Resp: 18 18  Temp: 98.3 F (36.8 C) 98.6 F (37 C)  SpO2: 92% 98%    Recent laboratory studies:  Lab Results  Component Value Date   HGB 15.1 06/11/2018   HGB 15.5 05/02/2018   HGB 16.2 06/09/2010   Lab Results  Component Value Date   WBC 10.2 06/11/2018   PLT 309 06/11/2018   Lab Results  Component Value Date   INR 2.2 (H) 03/25/2019   Lab Results  Component Value Date   NA 141 06/11/2018   K 4.1 06/11/2018   CL 107 06/11/2018   CO2 25 06/11/2018   BUN 17 06/11/2018   CREATININE 1.35 (H) 06/11/2018   GLUCOSE 115 (H) 06/11/2018    Discharge Medications:   Allergies as of 03/25/2019      Reactions   Talwin [pentazocine] Nausea And Vomiting      Medication List    STOP taking these medications   cyclobenzaprine 10 MG tablet Commonly known as: FLEXERIL   diclofenac sodium 1 % Gel Commonly known as: VOLTAREN   HYDROcodone-acetaminophen 5-325 MG tablet Commonly known as: Norco   NONFORMULARY OR COMPOUNDED ITEM   ondansetron 4 MG tablet Commonly known as: Zofran     TAKE these  medications   multivitamin with minerals Tabs tablet Take 1 tablet by mouth at bedtime.   oxyCODONE 5 MG immediate release tablet Commonly known as: Roxicodone Take 1 tablet (5 mg total) by mouth every 4 (four) hours as needed for severe pain.   sennosides-docusate sodium 8.6-50 MG tablet Commonly known as: SENOKOT-S Take 2 tablets by mouth daily.   warfarin 5 MG tablet Commonly known as: COUMADIN Take as directed. If you are unsure how to take this medication, talk to your nurse or doctor. Original instructions: 10 mg MWF, sat and 5 mg Tue, Thurs and Sun What changed:   how much to take  how to take this  when to take this  additional instructions             Discharge Care Instructions  (From admission, onward)         Start     Ordered   03/25/19 0000  Non weight bearing     03/25/19 0958          Diagnostic Studies: Dg Tibia/fibula Left  Result Date: 03/24/2019 CLINICAL DATA:  Left ankle fracture. External fixator placement. EXAM: DG C-ARM 1-60 MIN; LEFT TIBIA AND FIBULA - 2 VIEW COMPARISON:  Preoperative radiographs. FLUOROSCOPY TIME:  Fluoroscopy Time:  Not provided. Radiation Exposure Index (if provided by the fluoroscopic device): Not provided. Number of Acquired Spot Images: 6 FINDINGS: Six fluoroscopic spot views obtained in the operating room during external fixator placement fixating distal tibia and fibular fractures. Pins are placed in the proximal tibia and calcaneus. Fracture is in improved alignment compared to preoperative imaging. Total fluoroscopy time not reported. IMPRESSION: Fluoroscopic spot views obtained during external fixator placement fixating distal tibia and fibular fractures. Electronically Signed   By: Keith Rake M.D.   On: 03/24/2019 19:26   Dg Ankle 2 Views Left  Result Date: 03/24/2019 CLINICAL DATA:  Post reduction of ankle fracture. EXAM: LEFT ANKLE - 2 VIEW COMPARISON:  Film earlier today FINDINGS: Overlying cast obscures fine detail. Interval relocation of the tibiotalar joint noted. Comminuted distal fibular fracture and MEDIAL malleolar fracture are noted in near-anatomic alignment and position. A posterior malleolar fracture is noted with 5 mm displacement. IMPRESSION: 1. Interval relocation of the tibiotalar joint with trimalleolar fractures as described. Electronically Signed   By: Margarette Canada M.D.   On: 03/24/2019 14:53   Dg Ankle Complete Left  Result Date: 03/24/2019 CLINICAL DATA:  Slipped and fell with ankle pain and deformity. EXAM: LEFT ANKLE COMPLETE - 3+ VIEW COMPARISON:  None. FINDINGS: Severely comminuted fracture of the distal fibular diaphysis is associated with medial  malleolar and posterior lip fractures of the distal tibia. Posterior and lateral dislocation of the talus relative to the tibia is noted. IMPRESSION: Trimalleolar fracture/dislocation of the left ankle with severely comminuted distal fibular component. Electronically Signed   By: Misty Stanley M.D.   On: 03/24/2019 12:29   Ct Ankle Left Wo Contrast  Result Date: 03/24/2019 CLINICAL DATA:  Trimalleolar ankle fracture dislocation. Post external fixator placement. EXAM: CT OF THE LEFT ANKLE WITHOUT CONTRAST TECHNIQUE: Multidetector CT imaging of the left ankle was performed according to the standard protocol. Multiplanar CT image reconstructions were also generated. COMPARISON:  Radiographs earlier this day. FINDINGS: Bones/Joint/Cartilage Comminuted displaced distal fibular fracture, proximal extent not included in the field of view. Oblique medial malleolar fracture with small fracture fragments in the joint anteriorly. Residual displacement anteriorly of 1 cm. Comminuted fracture of the posterior malleolus with reticular displacement  of 7 mm. Small fracture fragments in the joint posteriorly. External fixator pin through the calcaneus. No fracture of the talus or hindfoot structures. Ligaments Suboptimally assessed by CT. Muscles and Tendons Mild peroneal tendon thickening without evidence of entrapment. Thickening and irregularity of the posterior tibial tendon at the level of the tibial fracture. Achilles tendon is intact. Soft tissues Diffuse soft tissue edema about the ankle. IMPRESSION: 1. Trimalleolar ankle fracture. Small intra-articular fracture fragment posterior and anteromedially. External fixator pin through the calcaneus. 2. Mild peroneal tendon thickening without evidence of entrapment. Thickening and irregularity of the posterior tibial tendon at the level of the tibial fracture. Electronically Signed   By: Keith Rake M.D.   On: 03/24/2019 21:37   Dg Foot Complete Left  Result Date:  03/24/2019 CLINICAL DATA:  Slip and fall.  Ankle pain and deformity. EXAM: LEFT FOOT - COMPLETE 3+ VIEW COMPARISON:  None. FINDINGS: Better demonstrated on the ankle films performed at the same time, the patient has a trimalleolar fracture dislocation. No evidence for associated fracture involving the bony anatomy of the foot. Hallux valgus deformity noted with some mild degenerative changes in the MTP joint of the great toe. IMPRESSION: Trimalleolar fracture dislocation at the ankle. See ankle report dictated separately. 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 Electronically Signed   By: Misty Stanley M.D.   On: 03/24/2019 12:30   Dg C-arm 1-60 Min  Result Date: 03/24/2019 CLINICAL DATA:  Left ankle fracture. External fixator placement. EXAM: DG C-ARM 1-60 MIN; LEFT TIBIA AND FIBULA - 2 VIEW COMPARISON:  Preoperative radiographs. FLUOROSCOPY TIME:  Fluoroscopy Time:  Not provided. Radiation Exposure Index (if provided by the fluoroscopic device): Not provided. Number of Acquired Spot Images: 6 FINDINGS: Six fluoroscopic spot views obtained in the operating room during external fixator placement fixating distal tibia and fibular fractures. Pins are placed in the proximal tibia and calcaneus. Fracture is in improved alignment compared to preoperative imaging. Total fluoroscopy time not reported. IMPRESSION: Fluoroscopic spot views obtained during external fixator placement fixating distal tibia and fibular fractures. Electronically Signed   By: Keith Rake M.D.   On: 03/24/2019 19:26    Disposition:   Discharge Instructions    Non weight bearing   Complete by: As directed       Follow-up Information    Erle Crocker, MD. Schedule an appointment as soon as possible for a visit in 1 week.   Specialty: Orthopedic Surgery Contact information: [place] [address] [phone]            Signed: Johnny Bridge 03/25/2019, 9:59 AM

## 2019-03-25 NOTE — Plan of Care (Signed)

## 2019-03-30 ENCOUNTER — Other Ambulatory Visit: Payer: Self-pay | Admitting: Orthopaedic Surgery

## 2019-04-06 NOTE — Pre-Procedure Instructions (Signed)
[id number] [address], [place] [address] Phone: [phone] Fax: [phone]      Your procedure is scheduled on 04-12-19  Report to [facility] Main Entrance "A" at [employer] A.M., and check in at the Admitting office.  Call this number if you have problems the morning of surgery:  [phone]  Call [phone] if you have any questions prior to your surgery date Monday-Friday 8am-4pm    Remember:  Do not eat  after midnight the night before your surgery  You may drink clear liquids until 0445AM the morning of your surgery.   Clear liquids allowed are: Water, Non-Citrus Juices (without pulp), Carbonated Beverages, Clear Tea, Black Coffee Only, and Gatorade   Please complete your PRE-SURGERY ENSURE that was provided to you by 0445AM  the morning of surgery.  Please, if able, drink it in one setting. DO NOT SIP.  Take these medicines the morning of surgery with A SIP OF WATER : oxyCODONE (ROXICODONE) as needed  Follow your surgeon's instructions on when to stop WARFARIN.  If no instructions were given by your surgeon then you will need to call the office to get those instructions.    7 days prior to surgery STOP taking any Aspirin (unless otherwise instructed by your surgeon), Aleve, Naproxen, Ibuprofen, Motrin, Advil, Goody's, BC's, all herbal medications, fish oil, and all vitamins.    The Morning of Surgery  Do not wear jewelry.  Do not wear lotions, powders, or perfumes/colognes, or deodorant    Men may shave face and neck.  Do not bring valuables to the hospital.  [facility] is not responsible for any belongings or valuables.  If you are a smoker, DO NOT Smoke 24 hours prior to surgery IF you wear a CPAP at night please bring your mask, tubing, and machine the morning of surgery   Remember that you must have someone to transport you home after your surgery, and remain with you for 24 hours if you are  discharged the same day.   Contacts, glasses, hearing aids, dentures or bridgework may not be worn into surgery.    Leave your suitcase in the car.  After surgery it may be brought to your room.  For patients admitted to the hospital, discharge time will be determined by your treatment team.  Patients discharged the day of surgery will not be allowed to drive home.    Special instructions:   [facility]- Preparing For Surgery  Before surgery, you can play an important role. Because skin is not sterile, your skin needs to be as free of germs as possible. You can reduce the number of germs on your skin by washing with CHG (chlorahexidine gluconate) Soap before surgery.  CHG is an antiseptic cleaner which kills germs and bonds with the skin to continue killing germs even after washing.    Oral Hygiene is also important to reduce your risk of infection.  Remember - BRUSH YOUR TEETH THE MORNING OF SURGERY WITH YOUR REGULAR TOOTHPASTE  Please do not use if you have an allergy to CHG or antibacterial soaps. If your skin becomes reddened/irritated stop using the CHG.  Do not shave (including legs and underarms) for at least 48 hours prior to first CHG shower. It is OK to shave your face.  Please follow these instructions carefully.   1. Shower the NIGHT BEFORE SURGERY and the MORNING OF SURGERY with CHG Soap.   2. If you chose  to wash your hair, wash your hair first as usual with your normal shampoo.  3. After you shampoo, rinse your hair and body thoroughly to remove the shampoo.  4. Use CHG as you would any other liquid soap. You can apply CHG directly to the skin and wash gently with a scrungie or a clean washcloth.   5. Apply the CHG Soap to your body ONLY FROM THE NECK DOWN.  Do not use on open wounds or open sores. Avoid contact with your eyes, ears, mouth and genitals (private parts). Wash Face and genitals (private parts)  with your normal soap.   6. Wash thoroughly, paying special  attention to the area where your surgery will be performed.  7. Thoroughly rinse your body with warm water from the neck down.  8. DO NOT shower/wash with your normal soap after using and rinsing off the CHG Soap.  9. Pat yourself dry with a CLEAN TOWEL.  10. Wear CLEAN PAJAMAS to bed the night before surgery, wear comfortable clothes the morning of surgery  11. Place CLEAN SHEETS on your bed the night of your first shower and DO NOT SLEEP WITH PETS.  Day of Surgery:  Do not apply any deodorants/lotions. Please shower the morning of surgery with the CHG soap  Please wear clean clothes to the hospital/surgery center.   Remember to brush your teeth WITH YOUR REGULAR TOOTHPASTE.   Please read over the  fact sheets that you were given.

## 2019-04-09 ENCOUNTER — Other Ambulatory Visit: Payer: Self-pay

## 2019-04-09 ENCOUNTER — Encounter (HOSPITAL_COMMUNITY)
Admission: RE | Admit: 2019-04-09 | Discharge: 2019-04-09 | Disposition: A | Discharge status: Home / Self Care | Payer: 59 | Source: Ambulatory Visit | Attending: Orthopaedic Surgery | Admitting: Orthopaedic Surgery

## 2019-04-09 ENCOUNTER — Other Ambulatory Visit (HOSPITAL_COMMUNITY)
Admission: RE | Admit: 2019-04-09 | Discharge: 2019-04-09 | Disposition: A | Discharge status: Home / Self Care | Payer: 59 | Source: Ambulatory Visit | Attending: Orthopaedic Surgery | Admitting: Orthopaedic Surgery

## 2019-04-09 ENCOUNTER — Encounter (HOSPITAL_COMMUNITY): Payer: Self-pay

## 2019-04-09 DIAGNOSIS — Z01812 Encounter for preprocedural laboratory examination: Secondary | ICD-10-CM | POA: Insufficient documentation

## 2019-04-09 DIAGNOSIS — Z20828 Contact with and (suspected) exposure to other viral communicable diseases: Secondary | ICD-10-CM | POA: Diagnosis not present

## 2019-04-09 LAB — CBC
HCT: 46.6 % (ref 39.0–52.0)
Hemoglobin: 15.7 g/dL (ref 13.0–17.0)
MCH: 30.5 pg (ref 26.0–34.0)
MCHC: 33.7 g/dL (ref 30.0–36.0)
MCV: 90.5 fL (ref 80.0–100.0)
Platelets: 356 10*3/uL (ref 150–400)
RBC: 5.15 MIL/uL (ref 4.22–5.81)
RDW: 12.1 % (ref 11.5–15.5)
WBC: 8.9 10*3/uL (ref 4.0–10.5)
nRBC: 0 % (ref 0.0–0.2)

## 2019-04-09 LAB — SURGICAL PCR SCREEN
MRSA, PCR: NEGATIVE
Staphylococcus aureus: POSITIVE — AB

## 2019-04-09 NOTE — Progress Notes (Signed)
PCP - Carles Collet Cardiologist - denies  Chest x-ray - n/a EKG - n/a  Blood Thinner Instructions: Stop coumadin (1) day prior to sx (04-11-19), per MD's instructions   COVID TEST- 04-09-19   Anesthesia review: n/a  Patient denies shortness of breath, fever, cough and chest pain at PAT appointment   Patient verbalized understanding of instructions that were given to them at the PAT appointment. Patient was also instructed that they will need to review over the PAT instructions again at home before surgery.

## 2019-04-10 LAB — NOVEL CORONAVIRUS, NAA (HOSP ORDER, SEND-OUT TO REF LAB; TAT 18-24 HRS): SARS-CoV-2, NAA: NOT DETECTED

## 2019-04-11 MED ORDER — DEXTROSE 5 % IV SOLN
3.0000 g | INTRAVENOUS | Status: AC
  Administered 2019-04-12: 08:00:00 3 g via INTRAVENOUS
  Filled 2019-04-11 (×2): qty 3000

## 2019-04-11 NOTE — Anesthesia Preprocedure Evaluation (Addendum)
Anesthesia Evaluation  Patient identified by MRN, date of birth, ID band Patient awake    Reviewed: Allergy & Precautions, NPO status , Patient's Chart, lab work & pertinent test results  Airway Mallampati: II  TM Distance: >3 FB Neck ROM: Full    Dental  (+) Teeth Intact, Dental Advisory Given   Pulmonary neg pulmonary ROS,    Pulmonary exam normal breath sounds clear to auscultation       Cardiovascular + DVT  Normal cardiovascular exam Rhythm:Regular Rate:Normal     Neuro/Psych negative neurological ROS     GI/Hepatic negative GI ROS, Neg liver ROS,   Endo/Other  Morbid obesity  Renal/GU negative Renal ROS     Musculoskeletal LEFT TRIMALLEOLAR ANKLE FRACTURE SYNDESMOSIS DISRUPTION    Abdominal   Peds  Hematology  (+) Blood dyscrasia (Warfarin), ,   Anesthesia Other Findings Day of surgery medications reviewed with the patient.  Reproductive/Obstetrics                            Anesthesia Physical Anesthesia Plan  ASA: III  Anesthesia Plan: General   Post-op Pain Management:  Regional for Post-op pain   Induction: Intravenous  PONV Risk Score and Plan: 3 and Midazolam, Dexamethasone, Ondansetron and Diphenhydramine  Airway Management Planned: Oral ETT  Additional Equipment:   Intra-op Plan:   Post-operative Plan: Extubation in OR  Informed Consent: I have reviewed the patients History and Physical, chart, labs and discussed the procedure including the risks, benefits and alternatives for the proposed anesthesia with the patient or authorized representative who has indicated his/her understanding and acceptance.     Dental advisory given  Plan Discussed with: CRNA  Anesthesia Plan Comments:       Anesthesia Quick Evaluation

## 2019-04-12 ENCOUNTER — Ambulatory Visit (HOSPITAL_COMMUNITY): Payer: 59 | Admitting: Anesthesiology

## 2019-04-12 ENCOUNTER — Encounter (HOSPITAL_COMMUNITY)
Admission: RE | Disposition: A | Discharge status: Home / Self Care | Payer: Self-pay | Source: Home / Self Care | Attending: Orthopaedic Surgery

## 2019-04-12 ENCOUNTER — Encounter (HOSPITAL_COMMUNITY): Payer: Self-pay

## 2019-04-12 ENCOUNTER — Other Ambulatory Visit: Payer: Self-pay

## 2019-04-12 ENCOUNTER — Ambulatory Visit (HOSPITAL_COMMUNITY): Payer: 59

## 2019-04-12 ENCOUNTER — Ambulatory Visit (HOSPITAL_COMMUNITY)
Admission: RE | Admit: 2019-04-12 | Discharge: 2019-04-12 | Disposition: A | Discharge status: Home / Self Care | Payer: 59 | Attending: Orthopaedic Surgery | Admitting: Orthopaedic Surgery

## 2019-04-12 DIAGNOSIS — Z7901 Long term (current) use of anticoagulants: Secondary | ICD-10-CM | POA: Insufficient documentation

## 2019-04-12 DIAGNOSIS — S82852A Displaced trimalleolar fracture of left lower leg, initial encounter for closed fracture: Secondary | ICD-10-CM | POA: Diagnosis present

## 2019-04-12 DIAGNOSIS — Z6841 Body Mass Index (BMI) 40.0 and over, adult: Secondary | ICD-10-CM | POA: Diagnosis not present

## 2019-04-12 DIAGNOSIS — Z86718 Personal history of other venous thrombosis and embolism: Secondary | ICD-10-CM | POA: Diagnosis not present

## 2019-04-12 DIAGNOSIS — Z419 Encounter for procedure for purposes other than remedying health state, unspecified: Secondary | ICD-10-CM

## 2019-04-12 HISTORY — PX: SYNDESMOSIS REPAIR: SHX5182

## 2019-04-12 HISTORY — PX: ORIF ANKLE FRACTURE: SHX5408

## 2019-04-12 HISTORY — PX: EXTERNAL FIXATION REMOVAL: SHX5040

## 2019-04-12 LAB — PROTIME-INR
INR: 2.3 — ABNORMAL HIGH (ref 0.8–1.2)
Prothrombin Time: 25.3 seconds — ABNORMAL HIGH (ref 11.4–15.2)

## 2019-04-12 SURGERY — OPEN REDUCTION INTERNAL FIXATION (ORIF) ANKLE FRACTURE
Anesthesia: General | Site: Ankle | Laterality: Left

## 2019-04-12 MED ORDER — PHENYLEPHRINE 40 MCG/ML (10ML) SYRINGE FOR IV PUSH (FOR BLOOD PRESSURE SUPPORT)
PREFILLED_SYRINGE | INTRAVENOUS | Status: DC | PRN
  Administered 2019-04-12 (×5): 40 ug via INTRAVENOUS

## 2019-04-12 MED ORDER — PROPOFOL 10 MG/ML IV BOLUS
INTRAVENOUS | Status: DC | PRN
  Administered 2019-04-12: 250 mg via INTRAVENOUS

## 2019-04-12 MED ORDER — MIDAZOLAM HCL 2 MG/2ML IJ SOLN
INTRAMUSCULAR | Status: AC
  Filled 2019-04-12: qty 2

## 2019-04-12 MED ORDER — ONDANSETRON HCL 4 MG/2ML IJ SOLN
INTRAMUSCULAR | Status: DC | PRN
  Administered 2019-04-12: 4 mg via INTRAVENOUS

## 2019-04-12 MED ORDER — MIDAZOLAM HCL 2 MG/2ML IJ SOLN
INTRAMUSCULAR | Status: DC | PRN
  Administered 2019-04-12: 2 mg via INTRAVENOUS

## 2019-04-12 MED ORDER — DEXAMETHASONE SODIUM PHOSPHATE 10 MG/ML IJ SOLN
INTRAMUSCULAR | Status: DC | PRN
  Administered 2019-04-12: 8 mg via INTRAVENOUS

## 2019-04-12 MED ORDER — 0.9 % SODIUM CHLORIDE (POUR BTL) OPTIME
TOPICAL | Status: DC | PRN
  Administered 2019-04-12: 08:00:00 1000 mL

## 2019-04-12 MED ORDER — DEXAMETHASONE SODIUM PHOSPHATE 10 MG/ML IJ SOLN
INTRAMUSCULAR | Status: AC
  Filled 2019-04-12: qty 1

## 2019-04-12 MED ORDER — PROPOFOL 10 MG/ML IV BOLUS
INTRAVENOUS | Status: AC
  Filled 2019-04-12: qty 40

## 2019-04-12 MED ORDER — SUCCINYLCHOLINE CHLORIDE 20 MG/ML IJ SOLN
INTRAMUSCULAR | Status: DC | PRN
  Administered 2019-04-12: 140 mg via INTRAVENOUS

## 2019-04-12 MED ORDER — OXYCODONE HCL 5 MG PO TABS: 5.0000 mg | ORAL_TABLET | ORAL | 0 refills | Status: AC | PRN

## 2019-04-12 MED ORDER — SODIUM CHLORIDE 0.9 % IV SOLN
INTRAVENOUS | Status: DC | PRN
  Administered 2019-04-12: 09:00:00 20 ug/min via INTRAVENOUS

## 2019-04-12 MED ORDER — PHENYLEPHRINE 40 MCG/ML (10ML) SYRINGE FOR IV PUSH (FOR BLOOD PRESSURE SUPPORT)
PREFILLED_SYRINGE | INTRAVENOUS | Status: AC
  Filled 2019-04-12: qty 10

## 2019-04-12 MED ORDER — FENTANYL CITRATE (PF) 100 MCG/2ML IJ SOLN
25.0000 ug | INTRAMUSCULAR | Status: DC | PRN
  Administered 2019-04-12 (×2): 50 ug via INTRAVENOUS

## 2019-04-12 MED ORDER — FENTANYL CITRATE (PF) 250 MCG/5ML IJ SOLN
INTRAMUSCULAR | Status: DC | PRN
  Administered 2019-04-12 (×5): 50 ug via INTRAVENOUS

## 2019-04-12 MED ORDER — SUGAMMADEX SODIUM 500 MG/5ML IV SOLN
INTRAVENOUS | Status: DC | PRN
  Administered 2019-04-12: 300 mg via INTRAVENOUS

## 2019-04-12 MED ORDER — ROCURONIUM BROMIDE 10 MG/ML (PF) SYRINGE
PREFILLED_SYRINGE | INTRAVENOUS | Status: DC | PRN
  Administered 2019-04-12: 60 mg via INTRAVENOUS
  Administered 2019-04-12 (×2): 20 mg via INTRAVENOUS

## 2019-04-12 MED ORDER — LIDOCAINE 2% (20 MG/ML) 5 ML SYRINGE
INTRAMUSCULAR | Status: AC
  Filled 2019-04-12: qty 5

## 2019-04-12 MED ORDER — FENTANYL CITRATE (PF) 100 MCG/2ML IJ SOLN
INTRAMUSCULAR | Status: AC
  Filled 2019-04-12: qty 2

## 2019-04-12 MED ORDER — SUGAMMADEX SODIUM 500 MG/5ML IV SOLN
INTRAVENOUS | Status: AC
  Filled 2019-04-12: qty 5

## 2019-04-12 MED ORDER — CEFAZOLIN SODIUM-DEXTROSE 2-4 GM/100ML-% IV SOLN
INTRAVENOUS | Status: AC
  Filled 2019-04-12: qty 100

## 2019-04-12 MED ORDER — PROMETHAZINE HCL 25 MG/ML IJ SOLN: 6.2500 mg | INTRAMUSCULAR | Status: DC | PRN

## 2019-04-12 MED ORDER — LACTATED RINGERS IV SOLN
INTRAVENOUS | Status: DC
  Administered 2019-04-12 (×2): via INTRAVENOUS

## 2019-04-12 MED ORDER — ACETAMINOPHEN 500 MG PO TABS
1000.0000 mg | ORAL_TABLET | Freq: Once | ORAL | Status: AC
  Administered 2019-04-12: 07:00:00 1000 mg via ORAL
  Filled 2019-04-12: qty 2

## 2019-04-12 MED ORDER — FENTANYL CITRATE (PF) 250 MCG/5ML IJ SOLN
INTRAMUSCULAR | Status: AC
  Filled 2019-04-12: qty 5

## 2019-04-12 MED ORDER — POVIDONE-IODINE 10 % EX SWAB: 2.0000 "application " | Freq: Once | CUTANEOUS | Status: DC

## 2019-04-12 MED ORDER — DIPHENHYDRAMINE HCL 50 MG/ML IJ SOLN
INTRAMUSCULAR | Status: AC
  Filled 2019-04-12: qty 1

## 2019-04-12 MED ORDER — PHENYLEPHRINE HCL (PRESSORS) 10 MG/ML IV SOLN
INTRAVENOUS | Status: AC
  Filled 2019-04-12: qty 1

## 2019-04-12 MED ORDER — BUPIVACAINE-EPINEPHRINE (PF) 0.5% -1:200000 IJ SOLN
INTRAMUSCULAR | Status: DC | PRN
  Administered 2019-04-12: 30 mL via PERINEURAL
  Administered 2019-04-12: 10 mL via PERINEURAL

## 2019-04-12 MED ORDER — ROCURONIUM BROMIDE 10 MG/ML (PF) SYRINGE
PREFILLED_SYRINGE | INTRAVENOUS | Status: AC
  Filled 2019-04-12: qty 10

## 2019-04-12 MED ORDER — DIPHENHYDRAMINE HCL 50 MG/ML IJ SOLN
INTRAMUSCULAR | Status: DC | PRN
  Administered 2019-04-12: 25 mg via INTRAVENOUS

## 2019-04-12 MED ORDER — ONDANSETRON HCL 4 MG/2ML IJ SOLN
INTRAMUSCULAR | Status: AC
  Filled 2019-04-12: qty 2

## 2019-04-12 SURGICAL SUPPLY — 72 items
ALCOHOL 70% 16 OZ (MISCELLANEOUS) ×3 IMPLANT
APL PRP STRL LF DISP 70% ISPRP (MISCELLANEOUS) ×2
BANDAGE ESMARK 6X9 LF (GAUZE/BANDAGES/DRESSINGS) IMPLANT
BIT DRILL 2.5 CANN LNG (BIT) ×2 IMPLANT
BIT DRILL 2.5 CANN STRL (BIT) ×2 IMPLANT
BIT DRILL 2.6 CANN (BIT) ×2 IMPLANT
BIT DRILL 3.5 CANN STRL (BIT) ×2 IMPLANT
BLADE 15 SAFETY STRL DISP (BLADE) ×9 IMPLANT
BLADE SURG 15 STRL LF DISP TIS (BLADE) ×2 IMPLANT
BLADE SURG 15 STRL SS (BLADE) ×6
BNDG CMPR 9X6 STRL LF SNTH (GAUZE/BANDAGES/DRESSINGS)
BNDG CMPR MED 10X6 ELC LF (GAUZE/BANDAGES/DRESSINGS) ×1
BNDG COHESIVE 4X5 TAN STRL (GAUZE/BANDAGES/DRESSINGS) IMPLANT
BNDG COHESIVE 6X5 TAN STRL LF (GAUZE/BANDAGES/DRESSINGS) IMPLANT
BNDG ELASTIC 6X10 VLCR STRL LF (GAUZE/BANDAGES/DRESSINGS) ×3 IMPLANT
BNDG ESMARK 6X9 LF (GAUZE/BANDAGES/DRESSINGS)
CANISTER SUCT 3000ML PPV (MISCELLANEOUS) ×3 IMPLANT
CHLORAPREP W/TINT 26 (MISCELLANEOUS) ×6 IMPLANT
COVER SURGICAL LIGHT HANDLE (MISCELLANEOUS) ×5 IMPLANT
COVER WAND RF STERILE (DRAPES) ×3 IMPLANT
CUFF TOURN SGL QUICK 34 (TOURNIQUET CUFF) ×3
CUFF TOURN SGL QUICK 42 (TOURNIQUET CUFF) IMPLANT
CUFF TRNQT CYL 34X4.125X (TOURNIQUET CUFF) ×1 IMPLANT
DRAPE C-ARM 42X72 X-RAY (DRAPES) ×3 IMPLANT
DRAPE C-ARMOR (DRAPES) ×2 IMPLANT
DRAPE OEC MINIVIEW 54X84 (DRAPES) IMPLANT
DRAPE U-SHAPE 47X51 STRL (DRAPES) ×3 IMPLANT
DRSG MEPITEL 4X7.2 (GAUZE/BANDAGES/DRESSINGS) ×3 IMPLANT
DRSG PAD ABDOMINAL 8X10 ST (GAUZE/BANDAGES/DRESSINGS) ×4 IMPLANT
DRSG XEROFORM 1X8 (GAUZE/BANDAGES/DRESSINGS) ×3 IMPLANT
ELECT REM PT RETURN 9FT ADLT (ELECTROSURGICAL) ×3
ELECTRODE REM PT RTRN 9FT ADLT (ELECTROSURGICAL) ×1 IMPLANT
GAUZE SPONGE 4X4 12PLY STRL (GAUZE/BANDAGES/DRESSINGS) IMPLANT
GAUZE SPONGE 4X4 12PLY STRL LF (GAUZE/BANDAGES/DRESSINGS) ×3 IMPLANT
GAUZE XEROFORM 5X9 LF (GAUZE/BANDAGES/DRESSINGS) ×2 IMPLANT
GLOVE BIOGEL M STRL SZ7.5 (GLOVE) ×3 IMPLANT
GLOVE BIOGEL PI IND STRL 8 (GLOVE) ×1 IMPLANT
GLOVE BIOGEL PI INDICATOR 8 (GLOVE) ×2
GOWN STRL REUS W/ TWL LRG LVL3 (GOWN DISPOSABLE) ×1 IMPLANT
GOWN STRL REUS W/ TWL XL LVL3 (GOWN DISPOSABLE) ×1 IMPLANT
GOWN STRL REUS W/TWL LRG LVL3 (GOWN DISPOSABLE) ×9
GOWN STRL REUS W/TWL XL LVL3 (GOWN DISPOSABLE) ×3
GUIDEWIRE 1.35MM (WIRE) ×8 IMPLANT
K-WIRE BB-TAK (WIRE) ×3
KIT BASIN OR (CUSTOM PROCEDURE TRAY) ×3 IMPLANT
KIT TURNOVER KIT B (KITS) ×3 IMPLANT
KWIRE BB-TAK (WIRE) IMPLANT
NS IRRIG 1000ML POUR BTL (IV SOLUTION) ×3 IMPLANT
PACK ORTHO EXTREMITY (CUSTOM PROCEDURE TRAY) ×3 IMPLANT
PAD ABD 8X10 STRL (GAUZE/BANDAGES/DRESSINGS) ×4 IMPLANT
PAD ARMBOARD 7.5X6 YLW CONV (MISCELLANEOUS) ×6 IMPLANT
PAD CAST 4YDX4 CTTN HI CHSV (CAST SUPPLIES) ×1 IMPLANT
PADDING CAST COTTON 4X4 STRL (CAST SUPPLIES) ×3
PLATE LOCK 12H SS STRAIGHT (Plate) ×2 IMPLANT
SCREW 4.5X50 (Screw) ×4 IMPLANT
SCREW LOW PROFILE 3.5X16 (Screw) ×12 IMPLANT
SCREW NLOCK T15 FT 18X3.5XST (Screw) ×2 IMPLANT
SCREW NON LOCK 3.5X18MM (Screw) ×6 IMPLANT
SCREW NON-LOCKING 3.5X20 ANKLE (Screw) ×6 IMPLANT
SPONGE LAP 18X18 RF (DISPOSABLE) ×3 IMPLANT
STAPLER VISISTAT 35W (STAPLE) ×2 IMPLANT
SUCTION FRAZIER HANDLE 10FR (MISCELLANEOUS) ×2
SUCTION TUBE FRAZIER 10FR DISP (MISCELLANEOUS) ×1 IMPLANT
SUT ETHILON 3 0 PS 1 (SUTURE) ×3 IMPLANT
SUT MNCRL AB 3-0 PS2 18 (SUTURE) ×6 IMPLANT
SUT PDS AB 2-0 CT1 27 (SUTURE) ×4 IMPLANT
SUT VIC AB 2-0 CT1 27 (SUTURE) ×6
SUT VIC AB 2-0 CT1 TAPERPNT 27 (SUTURE) ×2 IMPLANT
TOWEL GREEN STERILE (TOWEL DISPOSABLE) ×3 IMPLANT
TOWEL GREEN STERILE FF (TOWEL DISPOSABLE) ×3 IMPLANT
TUBE CONNECTING 12'X1/4 (SUCTIONS) ×1
TUBE CONNECTING 12X1/4 (SUCTIONS) ×2 IMPLANT

## 2019-04-12 NOTE — Anesthesia Procedure Notes (Signed)
Procedure Name: Intubation Date/Time: 04/12/2019 7:38 AM Performed by: Raenette Rover, CRNA Pre-anesthesia Checklist: Patient identified, Emergency Drugs available, Suction available and Patient being monitored Patient Re-evaluated:Patient Re-evaluated prior to induction Oxygen Delivery Method: Circle system utilized Preoxygenation: Pre-oxygenation with 100% oxygen Induction Type: IV induction Ventilation: Mask ventilation without difficulty Laryngoscope Size: Miller and 3 Grade View: Grade I Tube type: Oral Tube size: 7.5 mm Number of attempts: 1 Airway Equipment and Method: Stylet Placement Confirmation: ETT inserted through vocal cords under direct vision,  positive ETCO2 and breath sounds checked- equal and bilateral Secured at: 22 cm Tube secured with: Tape Dental Injury: Teeth and Oropharynx as per pre-operative assessment  Comments: Glidescope in room and available. Pt was easy to mask ventilate with no OAW utilized. Pt was a Grade 1 view with Sabra Heck 3 by CRNA. No issues noted.
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
## 2019-04-12 NOTE — Anesthesia Procedure Notes (Signed)
Anesthesia Regional Block: Popliteal block   Pre-Anesthetic Checklist: ,, timeout performed, Correct Patient, Correct Site, Correct Laterality, Correct Procedure, Correct Position, site marked, Risks and benefits discussed,  Surgical consent,  Pre-op evaluation,  At surgeon's request and post-op pain management  Laterality: Left  Prep: chloraprep       Needles:  Injection technique: Single-shot  Needle Type: Echogenic Needle     Needle Length: 9cm  Needle Gauge: 21     Additional Needles:   Procedures:,,,, ultrasound used (permanent image in chart),,,,  Narrative:  Start time: 04/12/2019 7:05 AM End time: 04/12/2019 7:15 AM Injection made incrementally with aspirations every 5 mL.  Performed by: Personally  Anesthesiologist: Catalina Gravel, MD  Additional Notes: No pain on injection. No increased resistance to injection. Injection made in 5cc increments.  Good needle visualization.  Patient tolerated procedure well.

## 2019-04-12 NOTE — H&P (Signed)
Antonio Davis is an 43 y.o. male.   Chief Complaint: Left trimalleolar ankle fracture status post closed reduction and external fixator placement HPI: Antonio Davis is here today for ex-fix removal and definitive fixation of his left ankle.  The recall on 03/24/2019 he was admitted for the above fracture.  He underwent closed reduction external fixator placement by Dr. Mardelle Matte.  Patient takes Coumadin and had an INR of 2.7.  He has been elevating his leg at home and is here for fixation.  Patient reports he has been nonweightbearing.  His pain has been improving.  He has been elevating his leg.  Denies any recent fevers or chills.  Past Medical History:  Diagnosis Date  . History of DVT (deep vein thrombosis)    R calf; 2010; on Coumadin since  . IBS (irritable bowel syndrome)     Past Surgical History:  Procedure Laterality Date  . ANKLE SURGERY    . ANTERIOR CRUCIATE LIGAMENT REPAIR Bilateral   . EXTERNAL FIXATION LEG Left 03/24/2019   Procedure: EXTERNAL FIXATION LEG;  Surgeon: Marchia Bond, MD;  Location: [facility];  Service: Orthopedics;  Laterality: Left;  . KNEE ARTHROSCOPY W/ MENISCAL REPAIR    . SHOULDER SURGERY     2/2 fracture  . VASECTOMY      Family History  Problem Relation Age of Onset  . Clotting disorder Father   . Clotting disorder Brother    Social History:  reports that he has never smoked. He has never used smokeless tobacco. He reports that he does not drink alcohol or use drugs.  Allergies:  Allergies  Allergen Reactions  . Talwin [Pentazocine] Nausea And Vomiting    Medications Prior to Admission  Medication Sig Dispense Refill  . Multiple Vitamin (MULTIVITAMIN WITH MINERALS) TABS tablet Take 1 tablet by mouth at bedtime.    Marland Kitchen oxyCODONE (ROXICODONE) 5 MG immediate release tablet Take 1 tablet (5 mg total) by mouth every 4 (four) hours as needed for severe pain. 30 tablet 0  . sennosides-docusate sodium (SENOKOT-S) 8.6-50 MG tablet Take 2 tablets by mouth daily. 30  tablet 1  . warfarin (COUMADIN) 5 MG tablet 10 mg MWF, sat and 5 mg Tue, Thurs and Sun (Patient taking differently: Take 5-10 mg by mouth See admin instructions. Take 2 tablets (10 mg) by mouth at bedtime Monday, Wednesday, Friday, Saturday; take 1 tablet (5 mg) at bedtime Sunday, Tuesday, Thursday.) 140 tablet 5    Results for orders placed or performed during the hospital encounter of 04/12/19 (from the past 48 hour(s))  Protime-INR     Status: Abnormal   Collection Time: 04/12/19  6:37 AM  Result Value Ref Range   Prothrombin Time 25.3 (H) 11.4 - 15.2 seconds   INR 2.3 (H) 0.8 - 1.2    Comment: (NOTE) INR goal varies based on device and disease states. Performed at [facility], [place] [address], [place], [place] [address]    No results found.  Review of Systems  Constitutional: Negative.   HENT: Negative.   Eyes: Negative.   Respiratory: Negative.   Cardiovascular: Negative.   Gastrointestinal: Negative.   Musculoskeletal:       Left ankle pain  Skin: Negative.   Neurological: Negative.   Psychiatric/Behavioral: Negative.     Blood pressure 139/83, pulse 69, temperature 98.9 F (37.2 C), temperature source Oral, resp. rate 18, height 6\' 1"  (1.854 m), weight (!) 143.3 kg, SpO2 98 %. Physical Exam  Constitutional: He appears well-developed.  HENT:  Head: Normocephalic.  Eyes: Conjunctivae are normal.  Neck: Neck supple.  Cardiovascular: Normal rate.  Respiratory: Effort normal.  GI: Soft.  Musculoskeletal:     Comments: Left ankle with external fixator in place.  Alignment of his ankle appears clinically well maintained.  Swelling is improved.  Ecchymosis about the foot and ankle region.  Some tenderness proximal about the lateral leg.  No soft tissue lacerations notable.  Patient is able to wiggle toes.  Dorsal sensation light touch on the dorsal and plantar foot.  Palpable dorsalis pedis pulse.  No evidence of bilateral upper extremity or right lower extremity  injury.  Neurological: He is alert.  Skin: Skin is warm.  Psychiatric: He has a normal mood and affect.     Assessment/Plan We will plan for external fixator removal and open treatment of his trimalleolar ankle fracture with possible syndesmotic fixation and or ankle arthrotomy.  Patient understands the risk benefits alternatives of surgery which included but not limited to wound healing complications, infection, nonunion, malunion, need for further surgery, development of arthritis and continued pain.  We also discussed demonstrating structures in the perioperative anesthetic risk which include death.  He understands these risks and wished to proceed.  He understands postoperative weightbearing restrictions and agrees to comply.  Postoperatively he will resume his Coumadin.  Erle Crocker, MD 04/12/2019, 7:09 AM

## 2019-04-12 NOTE — Anesthesia Procedure Notes (Signed)
Anesthesia Regional Block: Adductor canal block   Pre-Anesthetic Checklist: ,, timeout performed, Correct Patient, Correct Site, Correct Laterality, Correct Procedure, Correct Position, site marked, Risks and benefits discussed,  Surgical consent,  Pre-op evaluation,  At surgeon's request and post-op pain management  Laterality: Left  Prep: chloraprep       Needles:  Injection technique: Single-shot  Needle Type: Echogenic Needle     Needle Length: 9cm  Needle Gauge: 21     Additional Needles:   Procedures:,,,, ultrasound used (permanent image in chart),,,,  Narrative:  Start time: 04/12/2019 7:15 AM End time: 04/12/2019 7:20 AM Injection made incrementally with aspirations every 5 mL.  Performed by: Personally  Anesthesiologist: Catalina Gravel, MD  Additional Notes: No pain on injection. No increased resistance to injection. Injection made in 5cc increments.  Good needle visualization.  Patient tolerated procedure well.

## 2019-04-12 NOTE — Discharge Instructions (Signed)
DR. Sindy Mccune FOOT [facility] ANKLE SURGERY POST-OP INSTRUCTIONS   Pain Management 1. The numbing medicine and your leg will last around 18 hours, take a dose of your pain medicine as soon as you feel it wearing off to avoid rebound pain. 2. Keep your foot elevated above heart level.  Make sure that your heel hangs free ('floats'). 3. Take all prescribed medication as directed. 4. If taking narcotic pain medication you may want to use an over-the-counter stool softener to avoid constipation. 5. You may take over-the-counter NSAIDs (ibuprofen, naproxen, etc.) as well as over-the-counter acetaminophen as directed on the packaging as a supplement for your pain and may also use it to wean away from the prescription medication.  Activity ? Non-weightbearing ? Keep splint intact  First Postoperative Visit 1. Your first postop visit will be at least 2 weeks after surgery.  This should be scheduled when you schedule surgery. 2. If you do not have a postoperative visit scheduled please call [phone] to schedule an appointment. 3. At the appointment your incision will be evaluated for suture removal, x-rays will be obtained if necessary.  General Instructions 1. Swelling is very common after foot and ankle surgery.  It often takes 3 months for the foot and ankle to begin to feel comfortable.  Some amount of swelling will persist for 6-12 months. 2. DO NOT change the dressing.  If there is a problem with the dressing (too tight, loose, gets wet, etc.) please contact Dr. Devon Kingdon's office. 3. DO NOT get the dressing wet.  For showers you can use an over-the-counter cast cover or wrap a washcloth around the top of your dressing and then cover it with a plastic bag and tape it to your leg. 4. DO NOT soak the incision (no tubs, pools, bath, etc.) until you have approval from Dr. Laneice Meneely.  Contact Dr. Adairs office or go to Emergency Room if: 1. Temperature above 101 F. 2. Increasing pain that is unresponsive to pain  medication or elevation 3. Excessive redness or swelling in your foot 4. Dressing problems - excessive bloody drainage, looseness or tightness, or if dressing gets wet 5. Develop pain, swelling, warmth, or discoloration of your calf  

## 2019-04-12 NOTE — Transfer of Care (Signed)
Immediate Anesthesia Transfer of Care Note  Patient: Antonio Davis  Procedure(s) Performed: OPEN TREATMENT LEFT ANKLE AND SYNDESMOSIS ANKLE ARTHROTOMY (Left Ankle) SYNDESMOSIS REPAIR (Left Ankle) Removal External Fixation Leg (Left Ankle)  Patient Location: PACU  Anesthesia Type:GA combined with regional for post-op pain  Level of Consciousness: awake, alert , oriented and patient cooperative  Airway & Oxygen Therapy: Patient Spontanous Breathing and Patient connected to face mask oxygen  Post-op Assessment: Report given to RN and Post -op Vital signs reviewed and stable  Post vital signs: Reviewed and stable  Last Vitals:  Vitals Value Taken Time  BP 134/79 04/12/19 1017  Temp    Pulse 98 04/12/19 1020  Resp 16 04/12/19 1020  SpO2 98 % 04/12/19 1020  Vitals shown include unvalidated device data.  Last Pain:  Vitals:   04/12/19 0646  TempSrc:   PainSc: 6       Patients Stated Pain Goal: 2 ([id number])  Complications: No apparent anesthesia complications

## 2019-04-12 NOTE — Anesthesia Postprocedure Evaluation (Signed)
Anesthesia Post Note  Patient: Antonio Davis  Procedure(s) Performed: OPEN TREATMENT LEFT ANKLE AND SYNDESMOSIS ANKLE ARTHROTOMY (Left Ankle) SYNDESMOSIS REPAIR (Left Ankle) Removal External Fixation Leg (Left Ankle)     Patient location during evaluation: PACU Anesthesia Type: General Level of consciousness: awake and alert Pain management: pain level controlled Vital Signs Assessment: post-procedure vital signs reviewed and stable Respiratory status: spontaneous breathing, nonlabored ventilation, respiratory function stable and patient connected to nasal cannula oxygen Cardiovascular status: blood pressure returned to baseline and stable Postop Assessment: no apparent nausea or vomiting Anesthetic complications: no    Last Vitals:  Vitals:   04/12/19 1102 04/12/19 1111  BP: 135/84 132/83  Pulse: 80   Resp: 14   Temp:  36.6 C  SpO2: 92% 92%    Last Pain:  Vitals:   04/12/19 1017  TempSrc:   PainSc: 0-No pain                 Catalina Gravel

## 2019-04-13 ENCOUNTER — Encounter (HOSPITAL_COMMUNITY): Payer: Self-pay | Admitting: Orthopaedic Surgery

## 2019-05-30 ENCOUNTER — Other Ambulatory Visit: Payer: Self-pay

## 2019-05-30 ENCOUNTER — Encounter: Payer: Self-pay | Admitting: Physician Assistant

## 2019-05-30 ENCOUNTER — Ambulatory Visit: Payer: 59 | Admitting: Physician Assistant

## 2019-05-30 VITALS — BP 139/98 | HR 81 | Temp 97.1°F

## 2019-05-30 DIAGNOSIS — D689 Coagulation defect, unspecified: Secondary | ICD-10-CM | POA: Diagnosis not present

## 2019-05-30 DIAGNOSIS — Z7901 Long term (current) use of anticoagulants: Secondary | ICD-10-CM | POA: Diagnosis not present

## 2019-05-30 DIAGNOSIS — R748 Abnormal levels of other serum enzymes: Secondary | ICD-10-CM

## 2019-05-30 DIAGNOSIS — Z13220 Encounter for screening for lipoid disorders: Secondary | ICD-10-CM

## 2019-05-30 DIAGNOSIS — S82892S Other fracture of left lower leg, sequela: Secondary | ICD-10-CM

## 2019-05-30 LAB — POCT INR
INR: 3.1 — AB (ref 2.0–3.0)
PT: 37.2

## 2019-05-30 NOTE — Patient Instructions (Signed)
Prothrombin Time, International Normalized Ratio Test Why am I having this test? A prothrombin time (pro-time, PT) test may be ordered if:  You have certain medical conditions that cause abnormal bleeding or blood clotting. These can include: ? Liver disease. ? Systemic infection (sepsis). ? Inherited (genetic) bleeding disorders.  You are taking a medicine to prevent excessive blood clotting (anticoagulant), such as warfarin. ? If you are taking warfarin, you will likely be asked to have this test done at regular intervals. The results of this test will help your health care provider determine what dose of warfarin you need based on how quickly or slowly your blood clots. It is very important to have this test done as often as your health care provider recommends. What is being tested? A prothrombin time (pro-time, PT) test measures how many seconds it takes your blood to clot. The international normalized ratio (INR) is a calculation of blood clotting time based on your PT result. Most labs report both PT and INR values when reporting blood clotting times. What kind of sample is taken?  A blood sample is required for this test. It is usually collected by inserting a needle into a blood vessel. Tell a health care provider about:  Any blood disorders you have.  All medicines you are taking, including vitamins, herbs, eye drops, creams, and over-the-counter medicines. Do not stop, add, or change any medicines without letting your health care provider know.  The foods you regularly eat, especially foods that contain moderate or high amounts of vitamin K. It is important to eat a consistent amount of foods rich in vitamin K. Let your health care provider know if you have recently changed your diet.  If you drink alcohol. This can affect your lab results. How are the results reported? Your test results will be reported as values. Your health care provider will compare your results to normal  ranges that were established after testing a large group of people (reference ranges). Reference ranges may vary among different labs and hospitals. For this test, common reference ranges are:  Without anticoagulant treatment (control value): 11.0-12.5 seconds; 85-100%.  INR: 0.8-1.1. If you are taking warfarin, talk with your health care provider about what your INR result should be. Generally, an INR of 2.0-3.0 is desired for blood clot prevention. This depends on your medical conditions. What do the results mean?  A higher than normal PT or INR means that your blood takes longer to form a clot. This can result from: ? Certain medicines. ? Liver disease. ? Lack of certain proteins that form clots (coagulation factors). ? Lack of some vitamins.  A lower than normal PT or INR means that your blood can form a clot easily. This can result from: ? Supplements that contain Vitamin K. ? Medicines that contain estrogen, such as birth control pills or hormone replacement. ? Cancer. ? Some blood disorders (disseminated intravascular coagulation). Talk with your health care provider about what your test results mean. If you are taking warfarin or another anticoagulant, your result ranges may be different. Talk to your health care provider about what your results should be. Questions to ask your health care provider Ask your health care provider or the department that is doing the test:  When will my results be ready?  How will I get my results?  What are my treatment options?  What other tests do I need?  What are my next steps? Summary  A prothrombin time (pro-time, PT) test measures how  many seconds it takes your blood to clot.  You may have this test if you have a medical condition that causes abnormal bleeding or blood clotting, or if you are taking a medicine to prevent abnormal blood clotting.  A test result that is higher than normal indicates that your blood is taking too long  to form a clot. This result may occur because you lack some vitamins, take certain medicines, or have certain medical conditions.  Talk with your health care provider about what your results mean. This information is not intended to replace advice given to you by your health care provider. Make sure you discuss any questions you have with your health care provider. Document Released: 07/31/2004 Document Revised: 08/13/2017 Document Reviewed: 08/13/2017 Elsevier Patient Education  2020 [employer]  

## 2019-05-30 NOTE — Progress Notes (Signed)
Patient: Antonio Davis Male    DOB: Dec 11, 1975   43 y.o.   MRN: [id number] Visit Date: 05/30/2019  Today's Provider: Trinna Post, PA-C   Chief Complaint  Patient presents with  . Anticoagulation   Subjective:     HPI  Patient is here for follow up for chronic anticoagulation with warfarin. 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 Has declined hypercoagulable workup due to lack of findings with siblings and repeated DVTS.   Most recently fractured left ankle and has been undergoing reconstructive surgery related to that.  BP Readings from Last 3 Encounters:  05/30/19 (!) 139/98  04/12/19 132/83  04/09/19 123/77       Allergies  Allergen Reactions  . Talwin [Pentazocine] Nausea And Vomiting     Current Outpatient Medications:  Marland Kitchen  Multiple Vitamin (MULTIVITAMIN WITH MINERALS) TABS tablet, Take 1 tablet by mouth at bedtime., Disp: , Rfl:  .  sennosides-docusate sodium (SENOKOT-S) 8.6-50 MG tablet, Take 2 tablets by mouth daily., Disp: 30 tablet, Rfl: 1 .  warfarin (COUMADIN) 5 MG tablet, 10 mg MWF, sat and 5 mg Tue, Thurs and Sun (Patient taking differently: Take 5-10 mg by mouth See admin instructions. Take 2 tablets (10 mg) by mouth at bedtime Monday, Wednesday, Friday, Saturday; take 1 tablet (5 mg) at bedtime Sunday, Tuesday, Thursday.), Disp: 140 tablet, Rfl: 5  Review of Systems  Social History   Tobacco Use  . Smoking status: Never Smoker  . Smokeless tobacco: Never Used  Substance Use Topics  . Alcohol use: No      Objective:   BP (!) 139/98 (BP Location: Left Arm, Patient Position: Sitting, Cuff Size: Large)   Pulse 81   Temp (!) 97.1 F (36.2 C) (Temporal)  Vitals:   05/30/19 1029  BP: (!) 139/98  Pulse: 81  Temp: (!) 97.1 F (36.2 C)  TempSrc: Temporal  There is no height or weight on file to calculate BMI.   Physical Exam Constitutional:      Appearance: Normal appearance. He is obese.   Cardiovascular:     Pulses: Normal pulses.     Heart sounds: Normal heart sounds.  Pulmonary:     Breath sounds: Normal breath sounds.  Skin:    General: Skin is warm and dry.  Neurological:     Mental Status: He is alert and oriented to person, place, and time. Mental status is at baseline.  Psychiatric:        Mood and Affect: Mood normal.        Behavior: Behavior normal.      Results for orders placed or performed in visit on 05/30/19  POCT INR  Result Value Ref Range   INR 3.1 (A) 2.0 - 3.0   PT 37.2        Assessment & Plan    1. Chronic anticoagulation  INR within goal range. Follow up 1 month PT clinic.   - POCT INR  2. Coagulation defect ([place])   3. Lipid screening  - Lipid Profile  4. Elevated liver enzymes  - Comprehensive Metabolic Panel (CMET) - CBC with Differential  5. Closed fracture of left ankle, sequela  The entirety of the information documented in the History of Present Illness, Review of Systems and Physical Exam were personally obtained by me. Portions of this information were initially documented by [facility], CMA and reviewed by me for thoroughness and accuracy.  Trinna Post, PA-C  [facility]

## 2019-05-31 LAB — CBC WITH DIFFERENTIAL/PLATELET
Basophils Absolute: 0 10*3/uL (ref 0.0–0.2)
Basos: 0 %
EOS (ABSOLUTE): 0.1 10*3/uL (ref 0.0–0.4)
Eos: 1 %
Hematocrit: 44.1 % (ref 37.5–51.0)
Hemoglobin: 14.8 g/dL (ref 13.0–17.7)
Immature Grans (Abs): 0 10*3/uL (ref 0.0–0.1)
Immature Granulocytes: 1 %
Lymphocytes Absolute: 2.1 10*3/uL (ref 0.7–3.1)
Lymphs: 24 %
MCH: 29 pg (ref 26.6–33.0)
MCHC: 33.6 g/dL (ref 31.5–35.7)
MCV: 87 fL (ref 79–97)
Monocytes Absolute: 0.9 10*3/uL (ref 0.1–0.9)
Monocytes: 10 %
Neutrophils Absolute: 5.8 10*3/uL (ref 1.4–7.0)
Neutrophils: 64 %
Platelets: 260 10*3/uL (ref 150–450)
RBC: 5.1 x10E6/uL (ref 4.14–5.80)
RDW: 12.8 % (ref 11.6–15.4)
WBC: 8.9 10*3/uL (ref 3.4–10.8)

## 2019-05-31 LAB — COMPREHENSIVE METABOLIC PANEL
ALT: 54 IU/L — ABNORMAL HIGH (ref 0–44)
AST: 42 IU/L — ABNORMAL HIGH (ref 0–40)
Albumin/Globulin Ratio: 1.6 (ref 1.2–2.2)
Albumin: 4.4 g/dL (ref 4.0–5.0)
Alkaline Phosphatase: 94 IU/L (ref 39–117)
BUN/Creatinine Ratio: 14 (ref 9–20)
BUN: 15 mg/dL (ref 6–24)
Bilirubin Total: 0.4 mg/dL (ref 0.0–1.2)
CO2: 19 mmol/L — ABNORMAL LOW (ref 20–29)
Calcium: 9.5 mg/dL (ref 8.7–10.2)
Chloride: 105 mmol/L (ref 96–106)
Creatinine, Ser: 1.05 mg/dL (ref 0.76–1.27)
GFR calc Af Amer: 99 mL/min/{1.73_m2} (ref >59)
GFR calc non Af Amer: 85 mL/min/{1.73_m2} (ref >59)
Globulin, Total: 2.8 g/dL (ref 1.5–4.5)
Glucose: 122 mg/dL — ABNORMAL HIGH (ref 65–99)
Potassium: 4.2 mmol/L (ref 3.5–5.2)
Sodium: 140 mmol/L (ref 134–144)
Total Protein: 7.2 g/dL (ref 6.0–8.5)

## 2019-05-31 LAB — LIPID PANEL
Chol/HDL Ratio: 2.5 ratio (ref 0.0–5.0)
Cholesterol, Total: 115 mg/dL (ref 100–199)
HDL: 46 mg/dL (ref >39)
LDL Chol Calc (NIH): 56 mg/dL (ref 0–99)
Triglycerides: 59 mg/dL (ref 0–149)
VLDL Cholesterol Cal: 13 mg/dL (ref 5–40)

## 2019-06-01 ENCOUNTER — Telehealth: Payer: Self-pay

## 2019-06-01 NOTE — Telephone Encounter (Signed)
Patient was advised.  

## 2019-06-01 NOTE — Telephone Encounter (Signed)
-----   Message from Trinna Post, [place] sent at 05/31/2019  8:47 AM EST ----- Cholesterol normal. Liver enzymes elevated per his baseline. Sugar slightly up but he was not fasting so I am not concerned. CBC normal.

## 2020-01-06 ENCOUNTER — Other Ambulatory Visit: Payer: Self-pay | Admitting: Physician Assistant

## 2020-01-06 DIAGNOSIS — I82499 Acute embolism and thrombosis of other specified deep vein of unspecified lower extremity: Secondary | ICD-10-CM

## 2020-01-06 NOTE — Telephone Encounter (Signed)
Requested medications are due for refill today?  Yes - this refill cannot be delegated.    Requested medications are on active medication list?  Yes  Last Refill:   11/02/18 # 140 tabs with 5 refills   Future visit scheduled?  No   Notes to Clinic:  This refill cannot be delegated.

## 2020-01-09 NOTE — Telephone Encounter (Signed)
I have refilled x 30 days. He was supposed to follow up in our PT clinic one month after last visit, which he did not. Unfortunately, this is not a medication I can give indefinitely without monitoring. I have given him a month supply. He will either need to follow up with [place] and pay the cash price discount or establish with a community clinic such as [facility] [place] [facility] or [facility] services to get this monitored.

## 2020-01-09 NOTE — Telephone Encounter (Signed)
Called patient and he stayed he no longer have insurance and do not know when he will be able to get more insurance. Last PT/INR check was on 05/29/2018 and last reading for PT:37.2 & INR:3.1 Please advise.

## 2020-01-10 NOTE — Telephone Encounter (Addendum)
Called patient and no answer left voicemail message for patient to return call. If patient calls back okay for PEC to advise patient of message and to schedule patient for nurse visit to have PT/INR checked.

## 2020-02-12 ENCOUNTER — Other Ambulatory Visit: Payer: Self-pay | Admitting: Physician Assistant

## 2020-02-12 DIAGNOSIS — I82499 Acute embolism and thrombosis of other specified deep vein of unspecified lower extremity: Secondary | ICD-10-CM

## 2020-02-12 NOTE — Telephone Encounter (Signed)
Requested medication (s) are due for refill today: yes  Requested medication (s) are on the active medication list: yes  Last refill:  01/09/2020  Future visit scheduled:no  Notes to clinic: patient has not called back to schedule   Requested Prescriptions  Pending Prescriptions Disp Refills   warfarin (COUMADIN) 5 MG tablet [Pharmacy Med Name: WARFARIN SODIUM 5 MG TABLET] 48 tablet 0    Sig: TAKE 2 TABLETS M,W,F, SAT AND 1 TABLET ON TUE, [place]      Hematology:  Anticoagulants - warfarin Failed - 02/12/2020  9:30 AM      Failed - This refill cannot be delegated      Failed - If the patient is managed by Coumadin Clinic - route to their Pool. If not, forward to the provider.      Failed - INR in normal range and within 30 days    INR  Date Value Ref Range Status  05/30/2019 3.1 (A) 2.0 - 3.0 Final  04/12/2019 2.3 (H) 0.8 - 1.2 Final    Comment:    (NOTE) INR goal varies based on device and disease states. Performed at [facility], [place] [address], [place], [place] [address]           Failed - Valid encounter within last 3 months    Recent Outpatient Visits           8 months ago Chronic anticoagulation   [place], [place], [place]   1 year ago Deep vein thrombosis (DVT) of other vein of lower extremity, unspecified chronicity, unspecified laterality [facility])   [place], [place], [place]   1 year ago Deep vein thrombosis (DVT) of other vein of lower extremity, unspecified chronicity, unspecified laterality [facility])   [place], [place], PA-C   1 year ago Deep vein thrombosis (DVT) of other vein of lower extremity, unspecified chronicity, unspecified laterality [facility])   [place], [place], [place]

## 2020-02-12 NOTE — Telephone Encounter (Signed)
I have declined. He needs an OV. If he schedules an OV I will prescribe him the amount of pills to get to the OV.

## 2020-02-12 NOTE — Telephone Encounter (Signed)
Please advise rx request. Patient has been contacted to schedule PT appt and has not responded.

## 2021-06-10 ENCOUNTER — Ambulatory Visit: Payer: Self-pay | Admitting: Surgery

## 2021-06-10 DIAGNOSIS — C49A0 Gastrointestinal stromal tumor, unspecified site: Secondary | ICD-10-CM

## 2021-06-10 NOTE — H&P (Signed)
History of Present Illness: Antonio Davis is a 45 y.o. male who was referred to me for evaluation of gastric mass.  He presented with melena and anemia, and underwent EGD and colonoscopy for work-up of this.  The colonoscopy did not show any abnormalities, however on the EGD a submucosal 3 to 4 cm mass was identified in the antrum with no stigmata of bleeding.  Biopsy showed benign gastric mucosa.  He then underwent an EUS on 05/06/2021 by Dr. Paulita Fujita, which showed a 3 cm oval mass in the antrum of the stomach that appeared to originate from the muscularis propria.  FNA was nondiagnostic.  He was referred to discuss surgical resection.   The patient says he has not had any further melena.  He has occasional abdominal discomfort but no significant pain.  His father died of bladder cancer. He is on Coumadin for history of DVT, and reports that he previously has had clots in his legs while off of Coumadin for surgery.  He has had multiple orthopedic surgeries on both legs and says he usually has bridging with Lovenox prior to any procedures.  His anticoagulation is managed by his PCP.  He has not had any abdominal surgeries.     Review of Systems: A complete review of systems was obtained from the patient.  I have reviewed this information and discussed as appropriate with the patient.  See HPI as well for other ROS.       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 Havery Moros  ARTHROSCOPIC ROTATOR CUFF REPAIR      knee surgery      Open Treatment Left Ankle and Syndesmosis Ankle Arthrotomy   04/12/2019   Dr. Gwenlyn Saran      Allergies No Known Allergies     Current Outpatient Medications on File Prior to Visit Medication Sig Dispense Refill  warfarin (COUMADIN) 5 MG tablet as directed 10 mg MWF sat 5mg  TTH and SUN       No current facility-administered medications on file prior to visit.     Family History Family History Problem Relation Age of Onset  Deep vein thrombosis (DVT or abnormal blood clot formation) Father    Deep vein thrombosis (DVT or abnormal blood clot formation) Brother        Social History   Tobacco Use Smoking Status Never Smokeless Tobacco Never     Social History Social History    Socioeconomic History  Marital status: Married Tobacco Use  Smoking status: Never  Smokeless tobacco: Never Vaping Use  Vaping Use: Never used Substance and Sexual Activity  Alcohol use: Not Currently  Drug use: Never      Objective:     Vitals:   06/10/21 1322 BP: (!) 148/90 Pulse: 87 Temp: 36.7 C (98 F) SpO2: 97% Weight: (!) 144.7 kg (319 lb) Height: 185.4 cm (6\' 1" )   Body mass index is 42.09 kg/m.   Physical Exam Vitals reviewed.  Constitutional:      General: He is not in acute distress.    Appearance: Normal appearance.  HENT:     Head: Normocephalic and atraumatic.  Eyes:  General: No scleral icterus.    Conjunctiva/sclera: Conjunctivae normal.  Cardiovascular:     Rate and Rhythm: Normal rate and regular rhythm.  Pulmonary:     Effort: Pulmonary effort is normal. No respiratory distress.     Breath sounds: Normal breath sounds.  Abdominal:     General: There is no distension.     Palpations: Abdomen is soft.     Tenderness: There is no abdominal tenderness.     Comments: No surgical scars.  Musculoskeletal:        General: No swelling or deformity. Normal range of motion.     Cervical back: Normal range of motion.  Skin:    General: Skin is warm and dry.     Coloration: Skin is not jaundiced.  Neurological:     General: No focal deficit present.     Mental Status: He is alert  and oriented to person, place, and time.  Psychiatric:        Mood and Affect: Mood normal.        Behavior: Behavior normal.        Thought Content: Thought content normal.            Assessment and Plan: Diagnoses and all orders for this visit:   Malignant gastrointestinal stromal tumor, unspecified site (CMS-HCC) -     CT abdomen pelvis with contrast; Future   Other orders -     enoxaparin (LOVENOX) 150 mg/mL injection syringe; Inject 1 mL (150 mg total) subcutaneously every 12 (twelve) hours for 5 days Begin 5 days prior to scheduled surgery. Do not take on the morning of surgery. -     BD ALLERGY SYRINGE 1 mL 28 gauge x 1/2" syringe; Use 1 Syringe as directed for up to 30 days       This is a 45 year old male referred for a submucosal gastric antral nodule.  Endoscopic and EUS characteristics are most consistent with a gastrointestinal stromal tumor, although this has not been confirmed pathologically.  I recommended surgical excision via a laparoscopic gastric wedge resection both to obtain a definitive diagnosis and to treat the mass.  I discussed the possibility that wedge resection may significantly narrow the antrum and pyloric channel, in which case he will need a distal gastrectomy to achieve complete resection. We also discussed the possibility of conversion to an open procedure, particularly if a distal gastrectomy is required.  I discussed the benefits and risks including bleeding, anastomotic leak and duodenal stump leak.  The patient expressed understanding and agrees to proceed.  Prior to surgery he will need a CT abdomen/pelvis for staging and surgical planning.  We will also request clearance to hold Coumadin for 5 days prior to surgery from his PCP.  I sent him a prescription for Lovenox bridge and instructed him to begin this 5 days prior to surgery with a final dose to be given the evening before surgery.   Michaelle Birks, MD [facility],  Hepatobiliary and Pancreatic Surgery 06/10/21 2:19 PM

## 2021-06-15 ENCOUNTER — Other Ambulatory Visit: Payer: Self-pay | Admitting: Surgery

## 2021-06-15 DIAGNOSIS — C49A0 Gastrointestinal stromal tumor, unspecified site: Secondary | ICD-10-CM

## 2021-07-02 ENCOUNTER — Other Ambulatory Visit: Payer: 59

## 2021-07-21 ENCOUNTER — Ambulatory Visit
Admission: RE | Admit: 2021-07-21 | Discharge: 2021-07-21 | Disposition: A | Discharge status: Home / Self Care | Payer: Medicaid Other | Source: Ambulatory Visit | Attending: Surgery | Admitting: Surgery

## 2021-07-21 DIAGNOSIS — C49A0 Gastrointestinal stromal tumor, unspecified site: Secondary | ICD-10-CM

## 2021-07-21 MED ORDER — IOPAMIDOL (ISOVUE-300) INJECTION 61%
100.0000 mL | Freq: Once | INTRAVENOUS | Status: AC | PRN
  Administered 2021-07-21: 100 mL via INTRAVENOUS

## 2021-07-22 ENCOUNTER — Other Ambulatory Visit: Payer: Medicaid Other

## 2021-08-04 NOTE — Pre-Procedure Instructions (Addendum)
Surgical Instructions    Your procedure is scheduled on Friday 08/07/21.   Report to [facility] Main Entrance "A" at 08:00 A.M., then check in with the Admitting office.  Call this number if you have problems the morning of surgery:  [phone]   If you have any questions prior to your surgery date call [phone]: Open Monday-Friday 8am-4pm    Remember:  Do not eat after midnight the night before your surgery  You may drink clear liquids until 07:00 A.M. the morning of your surgery.   Clear liquids allowed are: Water, Non-Citrus Juices (without pulp), Carbonated Beverages, Clear Tea, Black Coffee ONLY (NO MILK, CREAM OR POWDERED CREAMER of any kind), and Gatorade  Patient Instructions  The night before surgery:  No food after midnight. ONLY clear liquids after midnight   The night before surgery drink ONE Pre-Surgery Clear Ensure with dinner and drink another Pre-Surgery Clear Ensure prior to bedtime.   The day of surgery  Drink ONE (1) Pre-Surgery Clear Ensure by 07:00 A.M. the morning of surgery. Drink in one sitting. Do not sip.  This drink was given to you during your hospital  pre-op appointment visit.  Nothing else to drink after completing the  Pre-Surgery Clear Ensure.         If you have questions, please contact your surgeons office.     Take these medicines the morning of surgery with A SIP OF WATER   NONE  Please follow your surgeon's instructions regarding when to stop taking Coumadin. If you have not received instructions then please contact your surgeon's office for instructions.  As of today, STOP taking any Aspirin (unless otherwise instructed by your surgeon) Aleve, Naproxen, Ibuprofen, Motrin, Advil, Goody's, BC's, all herbal medications, fish oil, and all vitamins.  After your COVID test   You are not required to quarantine however you are required to wear a well-fitting mask when you are out and around people not in your household.  If your mask  becomes wet or soiled, replace with a new one.  Wash your hands often with soap and water for 20 seconds or clean your hands with an alcohol-based hand sanitizer that contains at least 60% alcohol.  Do not share personal items.  Notify your provider: if you are in close contact with someone who has COVID  or if you develop a fever of 100.4 or greater, sneezing, cough, sore throat, shortness of breath or body aches.           Do not wear jewelry or makeup Do not wear lotions, powders, perfumes/colognes, or deodorant. Do not shave 48 hours prior to surgery.  Men may shave face and neck. Do not bring valuables to the hospital. DO Not wear nail polish, gel polish, artificial nails, or any other type of covering on natural nails (fingers and toes) If you have artificial nails or gel coating that need to be removed by a nail salon, please have this removed prior to surgery. Artificial nails or gel coating may interfere with anesthesia's ability to adequately monitor your vital signs.             [facility] is not responsible for any belongings or valuables.  Do NOT Smoke (Tobacco/Vaping)  24 hours prior to your procedure  If you use a CPAP at night, you may bring your mask for your overnight stay.   Contacts, glasses, hearing aids, dentures or partials may not be worn into surgery, please bring cases for these belongings  For patients admitted to the hospital, discharge time will be determined by your treatment team.   Patients discharged the day of surgery will not be allowed to drive home, and someone needs to stay with them for 24 hours.  NO VISITORS WILL BE ALLOWED IN PRE-OP WHERE PATIENTS ARE PREPPED FOR SURGERY.  ONLY 1 SUPPORT PERSON MAY BE PRESENT IN THE WAITING ROOM WHILE YOU ARE IN SURGERY.  IF YOU ARE TO BE ADMITTED, ONCE YOU ARE IN YOUR ROOM YOU WILL BE ALLOWED TWO (2) VISITORS. 1 (ONE) VISITOR MAY STAY OVERNIGHT BUT MUST ARRIVE TO THE ROOM BY 8pm.  Minor children may have two  parents present. Special consideration for safety and communication needs will be reviewed on a case by case basis.  Special instructions:    Oral Hygiene is also important to reduce your risk of infection.  Remember - BRUSH YOUR TEETH THE MORNING OF SURGERY WITH YOUR REGULAR TOOTHPASTE   [place]- Preparing For Surgery  Before surgery, you can play an important role. Because skin is not sterile, your skin needs to be as free of germs as possible. You can reduce the number of germs on your skin by washing with CHG (chlorahexidine gluconate) Soap before surgery.  CHG is an antiseptic cleaner which kills germs and bonds with the skin to continue killing germs even after washing.     Please do not use if you have an allergy to CHG or antibacterial soaps. If your skin becomes reddened/irritated stop using the CHG.  Do not shave (including legs and underarms) for at least 48 hours prior to first CHG shower. It is OK to shave your face.  Please follow these instructions carefully.     Shower the NIGHT BEFORE SURGERY and the MORNING OF SURGERY with CHG Soap.   If you chose to wash your hair, wash your hair first as usual with your normal shampoo. After you shampoo, rinse your hair and body thoroughly to remove the shampoo.  Then [employer] and genitals (private parts) with your normal soap and rinse thoroughly to remove soap.  After that Use CHG Soap as you would any other liquid soap. You can apply CHG directly to the skin and wash gently with a scrungie or a clean washcloth.   Apply the CHG Soap to your body ONLY FROM THE NECK DOWN.  Do not use on open wounds or open sores. Avoid contact with your eyes, ears, mouth and genitals (private parts). Wash Face and genitals (private parts)  with your normal soap.   Wash thoroughly, paying special attention to the area where your surgery will be performed.  Thoroughly rinse your body with warm water from the neck down.  DO NOT shower/wash with your  normal soap after using and rinsing off the CHG Soap.  Pat yourself dry with a CLEAN TOWEL.  Wear CLEAN PAJAMAS to bed the night before surgery  Place CLEAN SHEETS on your bed the night before your surgery  DO NOT SLEEP WITH PETS.   Day of Surgery:  Take a shower with CHG soap. Wear Clean/Comfortable clothing the morning of surgery Do not apply any deodorants/lotions.   Remember to brush your teeth WITH YOUR REGULAR TOOTHPASTE.   Please read over the following fact sheets that you were given.

## 2021-08-05 ENCOUNTER — Encounter (HOSPITAL_COMMUNITY): Payer: Self-pay

## 2021-08-05 ENCOUNTER — Encounter (HOSPITAL_COMMUNITY)
Admission: RE | Admit: 2021-08-05 | Discharge: 2021-08-05 | Disposition: A | Discharge status: Home / Self Care | Payer: Managed Care, Other (non HMO) | Source: Ambulatory Visit | Attending: Surgery | Admitting: Surgery

## 2021-08-05 ENCOUNTER — Other Ambulatory Visit: Payer: Self-pay

## 2021-08-05 VITALS — BP 139/79 | HR 71 | Temp 98.3°F | Resp 19 | Ht 72.0 in | Wt 322.0 lb

## 2021-08-05 DIAGNOSIS — Z7901 Long term (current) use of anticoagulants: Secondary | ICD-10-CM | POA: Insufficient documentation

## 2021-08-05 DIAGNOSIS — D214 Benign neoplasm of connective and other soft tissue of abdomen: Secondary | ICD-10-CM | POA: Insufficient documentation

## 2021-08-05 DIAGNOSIS — C49A0 Gastrointestinal stromal tumor, unspecified site: Secondary | ICD-10-CM

## 2021-08-05 DIAGNOSIS — Z20822 Contact with and (suspected) exposure to covid-19: Secondary | ICD-10-CM | POA: Insufficient documentation

## 2021-08-05 DIAGNOSIS — Z01812 Encounter for preprocedural laboratory examination: Secondary | ICD-10-CM | POA: Insufficient documentation

## 2021-08-05 DIAGNOSIS — Z86718 Personal history of other venous thrombosis and embolism: Secondary | ICD-10-CM | POA: Insufficient documentation

## 2021-08-05 DIAGNOSIS — K589 Irritable bowel syndrome without diarrhea: Secondary | ICD-10-CM | POA: Insufficient documentation

## 2021-08-05 DIAGNOSIS — Z01818 Encounter for other preprocedural examination: Secondary | ICD-10-CM

## 2021-08-05 LAB — CBC WITH DIFFERENTIAL/PLATELET
Abs Immature Granulocytes: 0.02 10*3/uL (ref 0.00–0.07)
Basophils Absolute: 0 10*3/uL (ref 0.0–0.1)
Basophils Relative: 0 %
Eosinophils Absolute: 0.1 10*3/uL (ref 0.0–0.5)
Eosinophils Relative: 1 %
HCT: 43.2 % (ref 39.0–52.0)
Hemoglobin: 13.7 g/dL (ref 13.0–17.0)
Immature Granulocytes: 0 %
Lymphocytes Relative: 26 %
Lymphs Abs: 1.7 10*3/uL (ref 0.7–4.0)
MCH: 27.7 pg (ref 26.0–34.0)
MCHC: 31.7 g/dL (ref 30.0–36.0)
MCV: 87.4 fL (ref 80.0–100.0)
Monocytes Absolute: 1 10*3/uL (ref 0.1–1.0)
Monocytes Relative: 15 %
Neutro Abs: 3.9 10*3/uL (ref 1.7–7.7)
Neutrophils Relative %: 58 %
Platelets: 230 10*3/uL (ref 150–400)
RBC: 4.94 MIL/uL (ref 4.22–5.81)
RDW: 18.2 % — ABNORMAL HIGH (ref 11.5–15.5)
WBC: 6.7 10*3/uL (ref 4.0–10.5)
nRBC: 0 % (ref 0.0–0.2)

## 2021-08-05 LAB — TYPE AND SCREEN
ABO/RH(D): O POS
Antibody Screen: NEGATIVE

## 2021-08-05 LAB — SARS CORONAVIRUS 2 (TAT 6-24 HRS): SARS Coronavirus 2: NEGATIVE

## 2021-08-05 NOTE — Progress Notes (Signed)
PCP - Dr. Claris Gower Cardiologist - denies  PPM/ICD - n/a Device Orders - n/a Rep Notified - n/a  Chest x-ray - n/a EKG - n/a Stress Test - denies ECHO - denies Cardiac Cath - denies  Sleep Study - denies CPAP - n/a  Fasting Blood Sugar - n/a Checks Blood Sugar _____ times a day- n/a  Blood Thinner Instructions: Instructions from Dr. Ayesha Rumpf note on 06/10/21. Coumadin Instructions for Surgery: Please stop taking your Warfarin (Coumadin) 5 days prior to your scheduled surgery. Once you stop taking Coumadin, begin taking Lovenox 2 times daily for the 5 days leading up to your surgery. Do NOT take any Lovenox on the morning of your surgery. Your final dose should be the night before surgery.  Patient states he took last dose of Coumadin on Sunday 08/02/21 but when he called the pharmacy there was not a prescription for Lovenox ready for pick up. Patient states he called Dr. Arelia Sneddon office on Monday to let him know that he needs the Lovenox prescription and was told it would be sent in. Patient did not take Lovenox on Monday or Tuesday and on Wednesday morning patient states he took 40 mg by using Lovenox he had left over from a prior prescription. This nurse called the pharmacy and was told that a prescription was received yesterday (Tuesday) and will be ready for pick up today. Per the pharmacist a prescription for Lovenox 40 mg BID was sent in by Ferd Hibbs, NP on Tuesday. Patient made aware prescription will be ready for pick up today and verbalized understanding.    Aspirin Instructions: n/a  ERAS Protcol - Yes PRE-SURGERY Ensure or G2- Ensure x 3 bottles  COVID TEST- 08/05/21. Pending.    Anesthesia review: Yes. History of blood clots. S/w AUlice Brilliant, PA during patient's PAT appointment to notify of what is going on with Lovenox.   Patient denies shortness of breath, fever, cough and chest pain at PAT appointment   All instructions explained to the patient, with a  verbal understanding of the material. Patient agrees to go over the instructions while at home for a better understanding. Patient also instructed to self quarantine after being tested for COVID-19. The opportunity to ask questions was provided.

## 2021-08-06 NOTE — Progress Notes (Signed)
Anesthesia Chart Review:  Case: [id number] Date/Time: 08/07/21 0945   Procedure: LAPAROSCOPIC POSSIBLE OPEN PARTIAL GASTRECTOMY   Anesthesia type: General   Pre-op diagnosis: gastrointestinal stromal tumor   Location: [facility] OR ROOM 02 / [place] [facility]   Surgeons: Antonio Bolt, MD       DISCUSSION: Patient is a 46 year old male scheduled for the above procedure. According to 06/10/21 new patient evaluation by Dr. Zenia Davis, Antonio Davis underwent an EGD and colonoscopy for workup of melena with anemia. "The colonoscopy did not show any abnormalities, however on the EGD a submucosal 3 to 4 cm mass was identified in the antrum with no stigmata of bleeding. Biopsy showed benign gastric mucosa. He then underwent an EUS on 05/06/2021 by Dr. Paulita Davis, which showed a 3 cm oval mass in the antrum of the stomach that appeared to originate from the muscularis propria. FNA was nondiagnostic. 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 BMI is consistent with morbid obesity.   Notes indicate that he had an unprovoked RLE DVT in 2010 and has been maintained on chronic anticoagulation due to personal and family history of DVT. He is typically bridged with Lovenox when his warfarin is on hold for procedures. Anticoagulation is managed by his PCP.   Dr. Zenia Davis advised holding warfarin for 5 days prior to surgery with Lovenox bridge. Appears she ultimately deferred Lovenox bridge instructions to his PCP. His last dose of warfarin was on 08/02/21, but his pharmacy did not receive the prescription for Lovenox until 08/04/21 after patient called provider for follow-up.  He started 40 mg BID on 08/05/21. Dr. Zenia Davis had instructed his last Lovenox dose should be on 08/06/21.    Presurgical COVID-19 test negative on 08/05/2021.  Anesthesia team to evaluate on the day of surgery.  He is for PT PTT on arrival.   VS: BP 139/79    Pulse  71    Temp 36.8 C    Resp 19    Ht 6' (1.829 m)    Wt (!) 146.1 kg    SpO2 97%    BMI 43.67 kg/m    PROVIDERS: Antonio Gower, MD is PCP  Antonio Silence, MD is GI   LABS: Labs reviewed: Acceptable for surgery. (all labs ordered are listed, but only abnormal results are displayed)  Labs Reviewed  CBC WITH DIFFERENTIAL/PLATELET - Abnormal; Notable for the following components:      Result Value   RDW 18.2 (*)    All other components within normal limits  SARS CORONAVIRUS 2 (TAT 6-24 HRS)  TYPE AND SCREEN    IMAGES: CT Abd/pelvis 07/21/21: IMPRESSION: 1. Soft tissue density mass in the posterior wall of the gastric antrum measuring at least 2.1 x 3.6 x 2.0 cm. This may represent a gastrointestinal stromal tumor or gastric lipoma. Tissue diagnosis for further evaluation would be helpful. No bulky lymphadenopathy. No other appreciable tumor. 2. Bowel loops are normal in caliber. No evidence of colitis or diverticulitis. Normal appendix.    EKG: N/A   CV: N/A  Past Medical History:  Diagnosis Date   History of DVT (deep vein thrombosis)    R calf; 2010; on Coumadin since   IBS (irritable bowel syndrome)     Past Surgical History:  Procedure Laterality Date   ANKLE SURGERY     ANTERIOR CRUCIATE LIGAMENT REPAIR Bilateral    EXTERNAL FIXATION LEG Left 03/24/2019   Procedure: EXTERNAL  FIXATION LEG;  Surgeon: Marchia Bond, MD;  Location: [place];  Service: Orthopedics;  Laterality: Left;   EXTERNAL FIXATION REMOVAL Left 04/12/2019   Procedure: Removal External Fixation Leg;  Surgeon: Erle Crocker, MD;  Location: [place];  Service: Orthopedics;  Laterality: Left;   KNEE ARTHROSCOPY W/ MENISCAL REPAIR     ORIF ANKLE FRACTURE Left 04/12/2019   Procedure: OPEN TREATMENT LEFT ANKLE AND SYNDESMOSIS ANKLE ARTHROTOMY;  Surgeon: Erle Crocker, MD;  Location: [place];  Service: Orthopedics;  Laterality: Left;  SURGERY REQUEST TIME 2.5 HOURS   SHOULDER SURGERY     2/2  fracture   SYNDESMOSIS REPAIR Left 04/12/2019   Procedure: SYNDESMOSIS REPAIR;  Surgeon: Erle Crocker, MD;  Location: [place];  Service: Orthopedics;  Laterality: Left;   VASECTOMY      MEDICATIONS:  diphenhydrAMINE (BENADRYL) 25 MG tablet   sennosides-docusate sodium (SENOKOT-S) 8.6-50 MG tablet   warfarin (COUMADIN) 10 MG tablet   warfarin (COUMADIN) 5 MG tablet   No current facility-administered medications for this encounter.    Antonio Gianotti, PA-C Surgical Short Stay/Anesthesiology [facility] Phone [phone] [facility] Phone [phone] 08/06/2021 9:27 AM

## 2021-08-06 NOTE — Anesthesia Preprocedure Evaluation (Addendum)
Anesthesia Evaluation  Patient identified by MRN, date of birth, ID band Patient awake    Reviewed: Allergy & Precautions, NPO status , Patient's Chart, lab work & pertinent test results  Airway Mallampati: I  TM Distance: >3 FB Neck ROM: Full    Dental no notable dental hx. (+) Teeth Intact, Dental Advisory Given   Pulmonary neg pulmonary ROS,    Pulmonary exam normal breath sounds clear to auscultation       Cardiovascular + DVT  Normal cardiovascular exam Rhythm:Regular Rate:Normal     Neuro/Psych negative neurological ROS  negative psych ROS   GI/Hepatic negative GI ROS, Neg liver ROS,   Endo/Other  Morbid obesity (BMI 44)  Renal/GU negative Renal ROS  negative genitourinary   Musculoskeletal negative musculoskeletal ROS (+)   Abdominal   Peds  Hematology  (+) Blood dyscrasia (on coumadin with lovenox bridge), ,   Anesthesia Other Findings   Reproductive/Obstetrics                           Anesthesia Physical Anesthesia Plan  ASA: 3  Anesthesia Plan: General   Post-op Pain Management: Tylenol PO (pre-op) and Ketamine IV   Induction: Intravenous  PONV Risk Score and Plan: 2 and Midazolam, Dexamethasone, Ondansetron and Diphenhydramine  Airway Management Planned: Oral ETT  Additional Equipment:   Intra-op Plan:   Post-operative Plan: Extubation in OR  Informed Consent: I have reviewed the patients History and Physical, chart, labs and discussed the procedure including the risks, benefits and alternatives for the proposed anesthesia with the patient or authorized representative who has indicated his/her understanding and acceptance.     Dental advisory given  Plan Discussed with: CRNA  Anesthesia Plan Comments: (PAT note written 08/06/2021 by Myra Gianotti, PA-C. )     Anesthesia Quick Evaluation

## 2021-08-07 ENCOUNTER — Other Ambulatory Visit: Payer: Self-pay

## 2021-08-07 ENCOUNTER — Inpatient Hospital Stay (HOSPITAL_COMMUNITY): Payer: Managed Care, Other (non HMO) | Admitting: Vascular Surgery

## 2021-08-07 ENCOUNTER — Inpatient Hospital Stay (HOSPITAL_COMMUNITY): Payer: Managed Care, Other (non HMO) | Admitting: Anesthesiology

## 2021-08-07 ENCOUNTER — Inpatient Hospital Stay (HOSPITAL_COMMUNITY)
Admission: RE | Admit: 2021-08-07 | Discharge: 2021-08-11 | DRG: 327 | Disposition: A | Discharge status: Home / Self Care | Payer: Managed Care, Other (non HMO) | Attending: Surgery | Admitting: Surgery

## 2021-08-07 ENCOUNTER — Encounter (HOSPITAL_COMMUNITY)
Admission: RE | Disposition: A | Discharge status: Home / Self Care | Payer: Self-pay | Source: Home / Self Care | Attending: Surgery

## 2021-08-07 ENCOUNTER — Encounter (HOSPITAL_COMMUNITY): Payer: Self-pay | Admitting: Surgery

## 2021-08-07 DIAGNOSIS — Z7901 Long term (current) use of anticoagulants: Secondary | ICD-10-CM

## 2021-08-07 DIAGNOSIS — Z885 Allergy status to narcotic agent status: Secondary | ICD-10-CM | POA: Diagnosis not present

## 2021-08-07 DIAGNOSIS — Z20822 Contact with and (suspected) exposure to covid-19: Secondary | ICD-10-CM | POA: Diagnosis present

## 2021-08-07 DIAGNOSIS — Z8249 Family history of ischemic heart disease and other diseases of the circulatory system: Secondary | ICD-10-CM | POA: Diagnosis not present

## 2021-08-07 DIAGNOSIS — Z86718 Personal history of other venous thrombosis and embolism: Secondary | ICD-10-CM

## 2021-08-07 DIAGNOSIS — K319 Disease of stomach and duodenum, unspecified: Secondary | ICD-10-CM | POA: Diagnosis present

## 2021-08-07 DIAGNOSIS — Z5331 Laparoscopic surgical procedure converted to open procedure: Secondary | ICD-10-CM

## 2021-08-07 DIAGNOSIS — K589 Irritable bowel syndrome without diarrhea: Secondary | ICD-10-CM | POA: Diagnosis present

## 2021-08-07 DIAGNOSIS — K3189 Other diseases of stomach and duodenum: Secondary | ICD-10-CM | POA: Diagnosis present

## 2021-08-07 DIAGNOSIS — D175 Benign lipomatous neoplasm of intra-abdominal organs: Secondary | ICD-10-CM | POA: Diagnosis present

## 2021-08-07 DIAGNOSIS — Z6841 Body Mass Index (BMI) 40.0 and over, adult: Secondary | ICD-10-CM | POA: Diagnosis not present

## 2021-08-07 DIAGNOSIS — Z903 Acquired absence of stomach [part of]: Secondary | ICD-10-CM

## 2021-08-07 HISTORY — PX: LAPAROSCOPIC GASTROSTOMY: SHX5896

## 2021-08-07 HISTORY — PX: LAPAROSCOPY: SHX197

## 2021-08-07 LAB — APTT: aPTT: 32 seconds (ref 24–36)

## 2021-08-07 LAB — PROTIME-INR
INR: 1.1 (ref 0.8–1.2)
Prothrombin Time: 14.1 seconds (ref 11.4–15.2)

## 2021-08-07 LAB — ABO/RH: ABO/RH(D): O POS

## 2021-08-07 SURGERY — LAPAROSCOPY, DIAGNOSTIC
Anesthesia: General | Site: Abdomen

## 2021-08-07 MED ORDER — ACETAMINOPHEN 500 MG PO TABS: 1000.0000 mg | ORAL_TABLET | Freq: Once | ORAL | Status: DC

## 2021-08-07 MED ORDER — ENSURE PRE-SURGERY PO LIQD: 592.0000 mL | Freq: Once | ORAL | Status: DC

## 2021-08-07 MED ORDER — GABAPENTIN 300 MG PO CAPS
ORAL_CAPSULE | ORAL | Status: AC
  Administered 2021-08-07: 300 mg via ORAL
  Filled 2021-08-07: qty 1

## 2021-08-07 MED ORDER — CEFAZOLIN IN SODIUM CHLORIDE 3-0.9 GM/100ML-% IV SOLN
3.0000 g | INTRAVENOUS | Status: AC
  Administered 2021-08-07: 3 g via INTRAVENOUS

## 2021-08-07 MED ORDER — 0.9 % SODIUM CHLORIDE (POUR BTL) OPTIME
TOPICAL | Status: DC | PRN
  Administered 2021-08-07: 1000 mL

## 2021-08-07 MED ORDER — PROPOFOL 10 MG/ML IV BOLUS
INTRAVENOUS | Status: DC | PRN
Start: 2021-08-07 — End: 2021-08-07
  Administered 2021-08-07: 200 mg via INTRAVENOUS

## 2021-08-07 MED ORDER — LIDOCAINE 2% (20 MG/ML) 5 ML SYRINGE
INTRAMUSCULAR | Status: DC | PRN
  Administered 2021-08-07: 100 mg via INTRAVENOUS

## 2021-08-07 MED ORDER — DIPHENHYDRAMINE HCL 25 MG PO CAPS: 25.0000 mg | ORAL_CAPSULE | Freq: Four times a day (QID) | ORAL | Status: DC | PRN

## 2021-08-07 MED ORDER — KETAMINE HCL 50 MG/5ML IJ SOSY
PREFILLED_SYRINGE | INTRAMUSCULAR | Status: AC
  Filled 2021-08-07: qty 5

## 2021-08-07 MED ORDER — GABAPENTIN 300 MG PO CAPS: 300.0000 mg | ORAL_CAPSULE | ORAL | Status: AC

## 2021-08-07 MED ORDER — LACTATED RINGERS IV SOLN: INTRAVENOUS | Status: DC

## 2021-08-07 MED ORDER — CEFAZOLIN SODIUM-DEXTROSE 2-4 GM/100ML-% IV SOLN
INTRAVENOUS | Status: AC
  Filled 2021-08-07: qty 100

## 2021-08-07 MED ORDER — ALBUTEROL SULFATE HFA 108 (90 BASE) MCG/ACT IN AERS
INHALATION_SPRAY | RESPIRATORY_TRACT | Status: DC | PRN
  Administered 2021-08-07: 2 via RESPIRATORY_TRACT

## 2021-08-07 MED ORDER — CHLORHEXIDINE GLUCONATE 0.12 % MT SOLN
OROMUCOSAL | Status: AC
  Administered 2021-08-07: 15 mL via OROMUCOSAL
  Filled 2021-08-07: qty 15

## 2021-08-07 MED ORDER — METRONIDAZOLE 500 MG/100ML IV SOLN
INTRAVENOUS | Status: AC
  Filled 2021-08-07: qty 100

## 2021-08-07 MED ORDER — ORAL CARE MOUTH RINSE: 15.0000 mL | Freq: Once | OROMUCOSAL | Status: AC

## 2021-08-07 MED ORDER — BUPIVACAINE-EPINEPHRINE 0.25% -1:200000 IJ SOLN
INTRAMUSCULAR | Status: DC | PRN
  Administered 2021-08-07: 30 mL

## 2021-08-07 MED ORDER — METRONIDAZOLE 500 MG/100ML IV SOLN
500.0000 mg | INTRAVENOUS | Status: AC
  Administered 2021-08-07: 500 mg via INTRAVENOUS

## 2021-08-07 MED ORDER — HYDROMORPHONE HCL 1 MG/ML IJ SOLN
0.2500 mg | INTRAMUSCULAR | Status: DC | PRN
  Administered 2021-08-07 (×2): 0.5 mg via INTRAVENOUS

## 2021-08-07 MED ORDER — KETAMINE HCL 10 MG/ML IJ SOLN
INTRAMUSCULAR | Status: DC | PRN
  Administered 2021-08-07 (×4): 25 mg via INTRAVENOUS

## 2021-08-07 MED ORDER — DOCUSATE SODIUM 100 MG PO CAPS
100.0000 mg | ORAL_CAPSULE | Freq: Two times a day (BID) | ORAL | Status: DC
  Administered 2021-08-07 – 2021-08-11 (×8): 100 mg via ORAL
  Filled 2021-08-07 (×8): qty 1

## 2021-08-07 MED ORDER — ONDANSETRON HCL 4 MG/2ML IJ SOLN
INTRAMUSCULAR | Status: DC | PRN
  Administered 2021-08-07: 4 mg via INTRAVENOUS

## 2021-08-07 MED ORDER — OXYCODONE HCL 5 MG PO TABS
5.0000 mg | ORAL_TABLET | ORAL | Status: DC | PRN
  Administered 2021-08-07 – 2021-08-11 (×10): 10 mg via ORAL
  Filled 2021-08-07 (×10): qty 2

## 2021-08-07 MED ORDER — PHENYLEPHRINE 40 MCG/ML (10ML) SYRINGE FOR IV PUSH (FOR BLOOD PRESSURE SUPPORT)
PREFILLED_SYRINGE | INTRAVENOUS | Status: DC | PRN
  Administered 2021-08-07 (×3): 80 ug via INTRAVENOUS

## 2021-08-07 MED ORDER — ACETAMINOPHEN 500 MG PO TABS
ORAL_TABLET | ORAL | Status: AC
  Administered 2021-08-07: 1000 mg via ORAL
  Filled 2021-08-07: qty 2

## 2021-08-07 MED ORDER — DEXAMETHASONE SODIUM PHOSPHATE 10 MG/ML IJ SOLN
INTRAMUSCULAR | Status: DC | PRN
  Administered 2021-08-07: 10 mg via INTRAVENOUS

## 2021-08-07 MED ORDER — ONDANSETRON 4 MG PO TBDP: 4.0000 mg | ORAL_TABLET | Freq: Four times a day (QID) | ORAL | Status: DC | PRN

## 2021-08-07 MED ORDER — BUPIVACAINE-EPINEPHRINE (PF) 0.25% -1:200000 IJ SOLN
INTRAMUSCULAR | Status: AC
  Filled 2021-08-07: qty 30

## 2021-08-07 MED ORDER — DIPHENHYDRAMINE HCL 50 MG/ML IJ SOLN: 25.0000 mg | Freq: Four times a day (QID) | INTRAMUSCULAR | Status: DC | PRN

## 2021-08-07 MED ORDER — ONDANSETRON HCL 4 MG/2ML IJ SOLN
4.0000 mg | Freq: Four times a day (QID) | INTRAMUSCULAR | Status: DC | PRN
  Administered 2021-08-07: 4 mg via INTRAVENOUS
  Filled 2021-08-07: qty 2

## 2021-08-07 MED ORDER — FENTANYL CITRATE (PF) 250 MCG/5ML IJ SOLN
INTRAMUSCULAR | Status: DC | PRN
  Administered 2021-08-07 (×3): 50 ug via INTRAVENOUS
  Administered 2021-08-07: 100 ug via INTRAVENOUS

## 2021-08-07 MED ORDER — HYDROMORPHONE HCL 1 MG/ML IJ SOLN
INTRAMUSCULAR | Status: AC
  Administered 2021-08-07: 0.5 mg via INTRAVENOUS
  Filled 2021-08-07: qty 1

## 2021-08-07 MED ORDER — DIPHENHYDRAMINE HCL 50 MG/ML IJ SOLN
INTRAMUSCULAR | Status: DC | PRN
Start: 2021-08-07 — End: 2021-08-07
  Administered 2021-08-07: 25 mg via INTRAVENOUS

## 2021-08-07 MED ORDER — MIDAZOLAM HCL 2 MG/2ML IJ SOLN
INTRAMUSCULAR | Status: AC
  Filled 2021-08-07: qty 2

## 2021-08-07 MED ORDER — ROCURONIUM BROMIDE 10 MG/ML (PF) SYRINGE
PREFILLED_SYRINGE | INTRAVENOUS | Status: DC | PRN
  Administered 2021-08-07: 100 mg via INTRAVENOUS

## 2021-08-07 MED ORDER — METHOCARBAMOL 750 MG PO TABS: 750.0000 mg | ORAL_TABLET | Freq: Four times a day (QID) | ORAL | Status: DC

## 2021-08-07 MED ORDER — ENOXAPARIN SODIUM 40 MG/0.4ML IJ SOSY
40.0000 mg | PREFILLED_SYRINGE | INTRAMUSCULAR | Status: DC
  Administered 2021-08-08 – 2021-08-11 (×4): 40 mg via SUBCUTANEOUS
  Filled 2021-08-07 (×4): qty 0.4

## 2021-08-07 MED ORDER — FENTANYL CITRATE (PF) 250 MCG/5ML IJ SOLN
INTRAMUSCULAR | Status: AC
  Filled 2021-08-07: qty 5

## 2021-08-07 MED ORDER — METHOCARBAMOL 750 MG PO TABS
750.0000 mg | ORAL_TABLET | Freq: Four times a day (QID) | ORAL | Status: DC
  Administered 2021-08-07 – 2021-08-11 (×14): 750 mg via ORAL
  Filled 2021-08-07 (×14): qty 1

## 2021-08-07 MED ORDER — ACETAMINOPHEN 500 MG PO TABS: 1000.0000 mg | ORAL_TABLET | ORAL | Status: AC

## 2021-08-07 MED ORDER — HYDROMORPHONE HCL 1 MG/ML IJ SOLN: 0.5000 mg | INTRAMUSCULAR | Status: DC | PRN

## 2021-08-07 MED ORDER — MIDAZOLAM HCL 2 MG/2ML IJ SOLN
INTRAMUSCULAR | Status: DC | PRN
  Administered 2021-08-07: 2 mg via INTRAVENOUS

## 2021-08-07 MED ORDER — ACETAMINOPHEN 500 MG PO TABS
1000.0000 mg | ORAL_TABLET | Freq: Four times a day (QID) | ORAL | Status: DC
  Administered 2021-08-07 – 2021-08-11 (×12): 1000 mg via ORAL
  Filled 2021-08-07 (×13): qty 2

## 2021-08-07 MED ORDER — PANTOPRAZOLE SODIUM 40 MG PO TBEC
40.0000 mg | DELAYED_RELEASE_TABLET | Freq: Every day | ORAL | Status: DC
  Administered 2021-08-07 – 2021-08-11 (×5): 40 mg via ORAL
  Filled 2021-08-07 (×5): qty 1

## 2021-08-07 MED ORDER — FENTANYL CITRATE (PF) 100 MCG/2ML IJ SOLN: 25.0000 ug | INTRAMUSCULAR | Status: DC | PRN

## 2021-08-07 MED ORDER — SUGAMMADEX SODIUM 200 MG/2ML IV SOLN
INTRAVENOUS | Status: DC | PRN
Start: 2021-08-07 — End: 2021-08-07
  Administered 2021-08-07: 300 mg via INTRAVENOUS

## 2021-08-07 MED ORDER — HYDROMORPHONE HCL 1 MG/ML IJ SOLN
INTRAMUSCULAR | Status: AC
  Filled 2021-08-07: qty 1

## 2021-08-07 MED ORDER — CHLORHEXIDINE GLUCONATE 0.12 % MT SOLN: 15.0000 mL | Freq: Once | OROMUCOSAL | Status: AC

## 2021-08-07 MED ORDER — ENSURE PRE-SURGERY PO LIQD: 296.0000 mL | Freq: Once | ORAL | Status: DC

## 2021-08-07 SURGICAL SUPPLY — 63 items
ADH SKN CLS APL DERMABOND .7 (GAUZE/BANDAGES/DRESSINGS) ×4
APL PRP STRL LF DISP 70% ISPRP (MISCELLANEOUS) ×2
BAG COUNTER SPONGE SURGICOUNT (BAG) ×3 IMPLANT
BAG SPEC RTRVL LRG 6X4 10 (ENDOMECHANICALS) ×2
BAG SPNG CNTER NS LX DISP (BAG) ×2
BLADE CLIPPER SURG (BLADE) ×2 IMPLANT
BLADE SURG 10 STRL SS (BLADE) ×3 IMPLANT
CANISTER SUCT 3000ML PPV (MISCELLANEOUS) ×3 IMPLANT
CHLORAPREP W/TINT 26 (MISCELLANEOUS) ×3 IMPLANT
COVER SURGICAL LIGHT HANDLE (MISCELLANEOUS) ×6 IMPLANT
DERMABOND ADVANCED (GAUZE/BANDAGES/DRESSINGS) ×2
DERMABOND ADVANCED .7 DNX12 (GAUZE/BANDAGES/DRESSINGS) ×3 IMPLANT
DRAPE WARM FLUID 44X44 (DRAPES) ×3 IMPLANT
ELECT REM PT RETURN 9FT ADLT (ELECTROSURGICAL) ×3
ELECTRODE REM PT RTRN 9FT ADLT (ELECTROSURGICAL) ×2 IMPLANT
GLOVE SURG ENC MOIS LTX SZ6 (GLOVE) ×5 IMPLANT
GLOVE SURG UNDER LTX SZ6.5 (GLOVE) ×3 IMPLANT
GLOVE SURG UNDER POLY LF SZ6 (GLOVE) ×3 IMPLANT
GOWN STRL REUS W/ TWL LRG LVL3 (GOWN DISPOSABLE) ×6 IMPLANT
GOWN STRL REUS W/TWL LRG LVL3 (GOWN DISPOSABLE) ×9
HANDLE SUCTION POOLE (INSTRUMENTS) ×1 IMPLANT
IRRIG SUCT STRYKERFLOW 2 WTIP (MISCELLANEOUS) ×3
IRRIGATION SUCT STRKRFLW 2 WTP (MISCELLANEOUS) ×1 IMPLANT
KIT BASIN OR (CUSTOM PROCEDURE TRAY) ×3 IMPLANT
L-HOOK LAP DISP 36CM (ELECTROSURGICAL)
LHOOK LAP DISP 36CM (ELECTROSURGICAL) IMPLANT
NDL INSUFFLATION 14GA 120MM (NEEDLE) ×1 IMPLANT
NEEDLE INSUFFLATION 14GA 120MM (NEEDLE) ×3 IMPLANT
NS IRRIG 1000ML POUR BTL (IV SOLUTION) ×6 IMPLANT
PAD ARMBOARD 7.5X6 YLW CONV (MISCELLANEOUS) ×6 IMPLANT
PENCIL BUTTON HOLSTER BLD 10FT (ELECTRODE) ×3 IMPLANT
POUCH SPECIMEN RETRIEVAL 10MM (ENDOMECHANICALS) ×3 IMPLANT
RELOAD STAPLE 60 3.6 BLU REG (STAPLE) IMPLANT
RELOAD STAPLER BLUE 60MM (STAPLE) IMPLANT
RETRACTOR WND ALEXIS 25 LRG (MISCELLANEOUS) ×1 IMPLANT
RTRCTR WOUND ALEXIS 25CM LRG (MISCELLANEOUS) ×3
SET IRRIG TUBING LAPAROSCOPIC (IRRIGATION / IRRIGATOR) ×3 IMPLANT
SET TUBE SMOKE EVAC HIGH FLOW (TUBING) ×3 IMPLANT
SHEARS HARMONIC ACE PLUS 36CM (ENDOMECHANICALS) ×3 IMPLANT
SLEEVE ENDOPATH XCEL 5M (ENDOMECHANICALS) ×9 IMPLANT
SOL ANTI FOG 6CC (MISCELLANEOUS) ×1 IMPLANT
SOLUTION ANTI FOG 6CC (MISCELLANEOUS) ×1
SPONGE T-LAP 18X18 ~~LOC~~+RFID (SPONGE) ×2 IMPLANT
STAPLE ECHEON FLEX 60 POW ENDO (STAPLE) IMPLANT
STAPLER RELOAD BLUE 60MM (STAPLE)
SUCTION POOLE HANDLE (INSTRUMENTS) ×3
SUT MNCRL AB 4-0 PS2 18 (SUTURE) ×5 IMPLANT
SUT PDS AB 1 TP1 96 (SUTURE) ×4 IMPLANT
SUT SILK 2 0 TIES 10X30 (SUTURE) ×2 IMPLANT
SUT SILK 2 0SH CR/8 30 (SUTURE) ×2 IMPLANT
SUT SILK 3 0SH CR/8 30 (SUTURE) ×4 IMPLANT
SUT VIC AB 3-0 SH 27 (SUTURE) ×6
SUT VIC AB 3-0 SH 27X BRD (SUTURE) ×1 IMPLANT
SUT VIC AB 3-0 SH 27XBRD (SUTURE) ×1 IMPLANT
SUT VIC AB 3-0 SH 8-18 (SUTURE) ×4 IMPLANT
SYR BULB IRRIG 60ML STRL (SYRINGE) ×3 IMPLANT
TOWEL GREEN STERILE (TOWEL DISPOSABLE) ×4 IMPLANT
TRAY LAPAROSCOPIC MC (CUSTOM PROCEDURE TRAY) ×3 IMPLANT
TROCAR XCEL 12X100 BLDLESS (ENDOMECHANICALS) ×3 IMPLANT
TROCAR XCEL NON-BLD 5MMX100MML (ENDOMECHANICALS) ×3 IMPLANT
TUBE CONNECTING 12X1/4 (SUCTIONS) ×2 IMPLANT
WATER STERILE IRR 1000ML POUR (IV SOLUTION) ×3 IMPLANT
YANKAUER SUCT BULB TIP NO VENT (SUCTIONS) ×2 IMPLANT

## 2021-08-07 NOTE — H&P (Signed)
Antonio Davis is an 46 y.o. male.   Chief Complaint: gastric mass HPI: Antonio Davis is a 46 yo male was referred for evaluation of a gastric mass.  He presented with anemia and underwent EGD for work-up, which showed a submucosal mass in the antrum of the stomach.  Biopsies were benign.  He then underwent an EUS on 10/26, which showed a 3 cm mass in the antrum of the stomach that appeared to originate in the muscularis propria.  FNA was nondiagnostic and he was referred to me.  He underwent a CT abdomen pelvis which again showed the mass, and features appear consistent with a GIST versus a lipoma.  He presents today for surgical resection.  He stopped taking Coumadin 5 days ago and has been bridged with lovenox.  Past Medical History:  Diagnosis Date   History of DVT (deep vein thrombosis)    R calf; 2010; on Coumadin since   IBS (irritable bowel syndrome)     Past Surgical History:  Procedure Laterality Date   ANKLE SURGERY     ANTERIOR CRUCIATE LIGAMENT REPAIR Bilateral    EXTERNAL FIXATION LEG Left 03/24/2019   Procedure: EXTERNAL FIXATION LEG;  Surgeon: Marchia Bond, MD;  Location: [place];  Service: Orthopedics;  Laterality: Left;   EXTERNAL FIXATION REMOVAL Left 04/12/2019   Procedure: Removal External Fixation Leg;  Surgeon: Erle Crocker, MD;  Location: [place];  Service: Orthopedics;  Laterality: Left;   KNEE ARTHROSCOPY W/ MENISCAL REPAIR     ORIF ANKLE FRACTURE Left 04/12/2019   Procedure: OPEN TREATMENT LEFT ANKLE AND SYNDESMOSIS ANKLE ARTHROTOMY;  Surgeon: Erle Crocker, MD;  Location: [place];  Service: Orthopedics;  Laterality: Left;  SURGERY REQUEST TIME 2.5 HOURS   SHOULDER SURGERY     2/2 fracture   SYNDESMOSIS REPAIR Left 04/12/2019   Procedure: SYNDESMOSIS REPAIR;  Surgeon: Erle Crocker, MD;  Location: [place];  Service: Orthopedics;  Laterality: Left;   VASECTOMY      Family History  Problem Relation Age of Onset   Clotting disorder Father    Clotting  disorder Brother    Social History:  reports that he has never smoked. He has never used smokeless tobacco. He reports that he does not drink alcohol and does not use drugs.  Allergies:  Allergies  Allergen Reactions   Talwin [Pentazocine] Nausea And Vomiting    Medications Prior to Admission  Medication Sig Dispense Refill   diphenhydrAMINE (BENADRYL) 25 MG tablet Take 25 mg by mouth every 6 (six) hours as needed for allergies.     enoxaparin (LOVENOX) 40 MG/0.4ML injection Inject 40 mg into the skin 2 (two) times daily. Lovenox bridge for surgery     warfarin (COUMADIN) 10 MG tablet Take 10 mg by mouth daily.     sennosides-docusate sodium (SENOKOT-S) 8.6-50 MG tablet Take 2 tablets by mouth daily. (Patient not taking: Reported on 07/28/2021) 30 tablet 1   warfarin (COUMADIN) 5 MG tablet 10 MG MWF, SAT AND 5 MG TUE, THURS AND SUN (Patient not taking: Reported on 07/28/2021) 48 tablet 0    Results for orders placed or performed during the hospital encounter of 08/07/21 (from the past 48 hour(s))  ABO/Rh     Status: None (Preliminary result)   Collection Time: 08/07/21  8:40 AM  Result Value Ref Range   ABO/RH(D) PENDING   PT- INR Day of Surgery per protocol     Status: None   Collection Time: 08/07/21  8:50 AM  Result Value Ref Range   Prothrombin Time 14.1 11.4 - 15.2 seconds   INR 1.1 0.8 - 1.2    Comment: (NOTE) INR goal varies based on device and disease states. Performed at [facility], [place] [address] [place], [place] [address]   PTT Day of Surgery per protocol     Status: None   Collection Time: 08/07/21  8:50 AM  Result Value Ref Range   aPTT 32 24 - 36 seconds    Comment: Performed at [facility] [address], [place], [place] [address]   No results found.  Review of Systems  Blood pressure (!) 146/87, pulse 67, temperature 97.9 F (36.6 C), temperature source Oral, resp. rate 18, height 6' (1.829 m), weight (!) 146.1 kg, SpO2 95 %. Physical  Exam Vitals reviewed.  Constitutional:      General: He is not in acute distress.    Appearance: Normal appearance.  HENT:     Head: Normocephalic and atraumatic.  Eyes:     Conjunctiva/sclera: Conjunctivae normal.  Cardiovascular:     Rate and Rhythm: Normal rate and regular rhythm.  Pulmonary:     Effort: Pulmonary effort is normal. No respiratory distress.  Abdominal:     General: There is no distension.     Palpations: Abdomen is soft.     Tenderness: There is no abdominal tenderness.  Musculoskeletal:        General: Normal range of motion.  Skin:    General: Skin is warm and dry.     Coloration: Skin is not jaundiced.  Neurological:     General: No focal deficit present.     Mental Status: He is alert and oriented to person, place, and time.  Psychiatric:        Mood and Affect: Mood normal.        Behavior: Behavior normal.        Thought Content: Thought content normal.     Assessment/Plan 46 yo male with a gastric mass, suspicious for a GIST. EUS with biopsy was nondiagnostic. Will proceed to OR for resection. This will be a gastric wedge resection vs a distal gastrectomy, which I discussed in detail with the patient. Informed consent obtained. Patient will be admitted postoperatively.  Dwan Bolt, MD 08/07/2021, 9:31 AM

## 2021-08-07 NOTE — Op Note (Signed)
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  EGD showed a 3 cm submucosal mass in the antrum of the stomach.  EUS with FNA was performed and imaging features were consistent with a GIST, however biopsy was nondiagnostic.  After discussion of the risks and benefits of surgery the patient agreed to proceed with surgical excision.  Findings: 3.8 cm intramural mass in the posterior stomach at the antrum, well-circumscribed and consistent with a benign lipoma.  Procedure details: Informed consent was obtained in the preoperative area prior to the procedure. The patient was brought to the operating room and placed on the table in the supine position.  General anesthesia was induced and appropriate lines and drains were placed for intraoperative monitoring. Perioperative antibiotics were administered per SCIP guidelines. The abdomen was prepped and draped in the usual sterile fashion. A pre-procedure timeout was taken verifying patient identity, surgical site and procedure to be performed.  A small skin incision was made in the upper midline, the subcutaneous tissue was divided with cautery, and the fascia was grasped and elevated.  A Veress needle was inserted through the fascia and intraperitoneal placement was confirmed with the saline drop test.  The abdomen was insufflated and a 12 mm port was placed.  Additional 5 mm ports were placed, 2 each in the right upper quadrant and in the left upper quadrant all under direct visualization.  The stomach was examined and  there were no obvious visible large masses.  The antrum was palpated with graspers, and a mobile smooth mass was palpable within the antrum.  The harmonic was used to create a small longitudinal gastrotomy just proximal to the palpable lesion.  The lesion was then able to be exteriorized through the gastrotomy, and it became apparent that the mass was intraluminal in the posterior gastric wall.  We felt that it would be difficult to perform a posterior gastric repair laparoscopically so the decision was made to convert to a laparotomy.  The ports were removed and the abdomen was desufflated.  An upper midline skin incision was made, the subcutaneous tissue was divided with cautery, and the linea alba was opened.  An [place] wound protector was placed.  The stomach was grasped with a Babcock clamp.  The intramural mass was exteriorized through the anterior gastrotomy.  It appeared to be freely mobile and just deep to the mucosa.  The mucosa was opened and the underlying mass was fatty and well-circumscribed, consistent with a lipoma.  This was easily dissected out from the stomach wall using gentle blunt dissection and cautery, without creating a full-thickness gastrotomy.  The mass was excised and measured at a diameter of 3.8 cm, and was sent for routine pathology.  The mucosal defect was closed with interrupted 3-0 Vicryl sutures.  The anterior gastrotomy was then closed transversely, using an inner layer of full-thickness interrupted 3-0 Vicryl sutures.  An outer layer of 3-0 silk Lembert sutures was then placed.  At the completion of the repair, the gastric lumen was palpated and was widely patent.  The abdomen was irrigated and appeared hemostatic.  The wound protector was removed, and the falciform ligament  was taken down off the abdominal wall and ligated with harmonic shears.  The falciform ligament was laid over the gastric repair, and secured by tying down the tails of the silk sutures at either end of the  repair loosely over the falciform ligament flap.  The 12 mm port site fascia was closed with a 0 Vicryl figure-of-eight suture.  The fascia was then closed at midline with a running looped 1 PDS suture.  Scarpa's layer and the deep dermis were closed with running 3-0 Vicryl suture.  The skin was closed at midline and at all port sites with a running subcuticular 4-0 Monocryl.  Dermabond was applied.  The patient tolerated the procedure well with no apparent complications.  All counts were correct x2 at the end of the procedure. The patient was extubated and taken to PACU in stable condition.  Michaelle Birks, MD 08/07/21 1:28 PM

## 2021-08-07 NOTE — Transfer of Care (Signed)
Immediate Anesthesia Transfer of Care Note  Patient: ATSUSHI YOM  Procedure(s) Performed: LAPAROSCOPY DIAGNOSTIC (Abdomen) GASTROSTOMY, EXCISION OF GASTRIC MASS (Abdomen)  Patient Location: PACU  Anesthesia Type:General  Level of Consciousness: awake, alert  and oriented  Airway & Oxygen Therapy: Patient Spontanous Breathing  Post-op Assessment: Report given to RN and Post -op Vital signs reviewed and stable  Post vital signs: Reviewed and stable  Last Vitals:  Vitals Value Taken Time  BP 149/93 08/07/21 1241  Temp 36.6 C 08/07/21 1240  Pulse 89 08/07/21 1250  Resp 18 08/07/21 1250  SpO2 94 % 08/07/21 1250  Vitals shown include unvalidated device data.  Last Pain:  Vitals:   08/07/21 1240  TempSrc:   PainSc: 0-No pain         Complications: No notable events documented.

## 2021-08-07 NOTE — Anesthesia Procedure Notes (Signed)
Procedure Name: Intubation Date/Time: 08/07/2021 10:33 AM Performed by: Griffin Dakin, CRNA Pre-anesthesia Checklist: Patient identified, Emergency Drugs available, Suction available and Patient being monitored Patient Re-evaluated:Patient Re-evaluated prior to induction Oxygen Delivery Method: Circle system utilized Preoxygenation: Pre-oxygenation with 100% oxygen Induction Type: IV induction Ventilation: Mask ventilation without difficulty Laryngoscope Size: Mac and 4 Grade View: Grade II Tube type: Oral Tube size: 7.5 mm Number of attempts: 1 Airway Equipment and Method: Stylet and Oral airway Placement Confirmation: ETT inserted through vocal cords under direct vision, positive ETCO2 and breath sounds checked- equal and bilateral Secured at: 24 cm Tube secured with: Tape Dental Injury: Teeth and Oropharynx as per pre-operative assessment

## 2021-08-08 LAB — BASIC METABOLIC PANEL
Anion gap: 9 (ref 5–15)
BUN: 14 mg/dL (ref 6–20)
CO2: 22 mmol/L (ref 22–32)
Calcium: 8.5 mg/dL — ABNORMAL LOW (ref 8.9–10.3)
Chloride: 106 mmol/L (ref 98–111)
Creatinine, Ser: 1.21 mg/dL (ref 0.61–1.24)
GFR, Estimated: >60 mL/min (ref >60)
Glucose, Bld: 158 mg/dL — ABNORMAL HIGH (ref 70–99)
Potassium: 4.3 mmol/L (ref 3.5–5.1)
Sodium: 137 mmol/L (ref 135–145)

## 2021-08-08 LAB — CBC
HCT: 42.2 % (ref 39.0–52.0)
Hemoglobin: 13.5 g/dL (ref 13.0–17.0)
MCH: 27.8 pg (ref 26.0–34.0)
MCHC: 32 g/dL (ref 30.0–36.0)
MCV: 86.8 fL (ref 80.0–100.0)
Platelets: 251 10*3/uL (ref 150–400)
RBC: 4.86 MIL/uL (ref 4.22–5.81)
RDW: 17.8 % — ABNORMAL HIGH (ref 11.5–15.5)
WBC: 20.8 10*3/uL — ABNORMAL HIGH (ref 4.0–10.5)
nRBC: 0 % (ref 0.0–0.2)

## 2021-08-08 MED ORDER — WARFARIN SODIUM 5 MG PO TABS
10.0000 mg | ORAL_TABLET | Freq: Every day | ORAL | Status: AC
  Administered 2021-08-08: 10 mg via ORAL
  Filled 2021-08-08: qty 2

## 2021-08-08 MED ORDER — WARFARIN - PHYSICIAN DOSING INPATIENT: Freq: Every day | Status: DC

## 2021-08-08 NOTE — Plan of Care (Signed)

## 2021-08-08 NOTE — Progress Notes (Signed)
[facility] Progress Note:   1 Day Post-Op  Subjective: Mental status is clear.  Complaints bruise on abdomen from Lovenox shot. Objective: Vital signs in last 24 hours: Temp:  [97.7 F (36.5 C)-100.1 F (37.8 C)] 99.6 F (37.6 C) (01/28 0549) Pulse Rate:  [64-93] 82 (01/28 0549) Resp:  [14-19] 16 (01/27 1700) BP: (116-149)/(72-93) 133/79 (01/28 0549) SpO2:  [92 %-96 %] 93 % (01/28 0549)  Intake/Output from previous day: 01/27 0701 - 01/28 0700 In: 885.9 [I.V.:885.9] Out: 1575 [Urine:1475; Blood:100] Intake/Output this shift: No intake/output data recorded.  Physical Exam: Work of breathing is normal.  Incisions OK.  No flatus or BM  Lab Results:  Results for orders placed or performed during the hospital encounter of 08/07/21 (from the past 48 hour(s))  ABO/Rh     Status: None   Collection Time: 08/07/21  8:40 AM  Result Value Ref Range   ABO/RH(D)      O POS Performed at [facility] [address] [place], [place] [address]   PT- INR Day of Surgery per protocol     Status: None   Collection Time: 08/07/21  8:50 AM  Result Value Ref Range   Prothrombin Time 14.1 11.4 - 15.2 seconds   INR 1.1 0.8 - 1.2    Comment: (NOTE) INR goal varies based on device and disease states. Performed at [facility], [place] [address], [place], [place] [address]   PTT Day of Surgery per protocol     Status: None   Collection Time: 08/07/21  8:50 AM  Result Value Ref Range   aPTT 32 24 - 36 seconds    Comment: Performed at [facility] [address], [place], [place] [address]  CBC     Status: Abnormal   Collection Time: 08/08/21 12:45 AM  Result Value Ref Range   WBC 20.8 (H) 4.0 - 10.5 K/uL   RBC 4.86 4.22 - 5.81 MIL/uL   Hemoglobin 13.5 13.0 - 17.0 g/dL   HCT 42.2 39.0 - 52.0 %   MCV 86.8 80.0 - 100.0 fL   MCH 27.8 26.0 - 34.0 pg   MCHC 32.0 30.0 - 36.0 g/dL   RDW 17.8 (H) 11.5 - 15.5 %   Platelets 251 150 - 400 K/uL   nRBC 0.0 0.0 - 0.2 %     Comment: Performed at [facility], [place] [address] [place], [place] [address]  Basic metabolic panel     Status: Abnormal   Collection Time: 08/08/21 12:45 AM  Result Value Ref Range   Sodium 137 135 - 145 mmol/L   Potassium 4.3 3.5 - 5.1 mmol/L   Chloride 106 98 - 111 mmol/L   CO2 22 22 - 32 mmol/L   Glucose, Bld 158 (H) 70 - 99 mg/dL    Comment: Glucose reference range applies only to samples taken after fasting for at least 8 hours.   BUN 14 6 - 20 mg/dL   Creatinine, Ser 1.21 0.61 - 1.24 mg/dL   Calcium 8.5 (L) 8.9 - 10.3 mg/dL   GFR, Estimated >60 >60 mL/min    Comment: (NOTE) Calculated using the CKD-EPI Creatinine Equation (2021)    Anion gap 9 5 - 15    Comment: Performed at [facility] [address] [place], [place] [address]    Radiology/Results: No results found.  Anti-infectives: Anti-infectives (From admission, onward)    Start     Dose/Rate Route Frequency Ordered  Stop   08/07/21 0830  ceFAZolin (ANCEF) IVPB 3g/100 mL premix       See Hyperspace for full Linked Orders Report.   3 g 200 mL/hr over 30 Minutes Intravenous On call to O.R. 08/07/21 0816 08/07/21 1038   08/07/21 0830  metroNIDAZOLE (FLAGYL) IVPB 500 mg       See Hyperspace for full Linked Orders Report.   500 mg 100 mL/hr over 60 Minutes Intravenous On call to O.R. 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  Hold diet where he is for now.   1 Day Post-Op    LOS: 1 day   Matt B. Hassell Done, MD, [facility] [phone] to reach the surgeon on call.    08/08/2021 9:37 AM clear

## 2021-08-09 LAB — PROTIME-INR
INR: 1.1 (ref 0.8–1.2)
Prothrombin Time: 14 seconds (ref 11.4–15.2)

## 2021-08-09 MED ORDER — WARFARIN SODIUM 5 MG PO TABS
10.0000 mg | ORAL_TABLET | Freq: Every day | ORAL | Status: AC
  Administered 2021-08-09: 10 mg via ORAL
  Filled 2021-08-09: qty 2

## 2021-08-09 NOTE — Progress Notes (Signed)
[facility] Progress Note:   2 Days Post-Op  Subjective: Mental status is clear.  Complaints minimal. Objective: Vital signs in last 24 hours: Temp:  [98.5 F (36.9 C)-99.1 F (37.3 C)] 99.1 F (37.3 C) (01/29 0907) Pulse Rate:  [70-86] 70 (01/29 0907) Resp:  [18] 18 (01/29 0907) BP: (123-139)/(71-82) 127/71 (01/29 0907) SpO2:  [90 %-94 %] 91 % (01/29 0907)  Intake/Output from previous day: 01/28 0701 - 01/29 0700 In: 2406.1 [P.O.:240; I.V.:2166.1] Out: 950 [Urine:950] Intake/Output this shift: No intake/output data recorded.  Physical Exam: Work of breathing is normal.  Incisions OK.  Bruise from Lovenox unchanged.    Lab Results:  Results for orders placed or performed during the hospital encounter of 08/07/21 (from the past 48 hour(s))  CBC     Status: Abnormal   Collection Time: 08/08/21 12:45 AM  Result Value Ref Range   WBC 20.8 (H) 4.0 - 10.5 K/uL   RBC 4.86 4.22 - 5.81 MIL/uL   Hemoglobin 13.5 13.0 - 17.0 g/dL   HCT 42.2 39.0 - 52.0 %   MCV 86.8 80.0 - 100.0 fL   MCH 27.8 26.0 - 34.0 pg   MCHC 32.0 30.0 - 36.0 g/dL   RDW 17.8 (H) 11.5 - 15.5 %   Platelets 251 150 - 400 K/uL   nRBC 0.0 0.0 - 0.2 %    Comment: Performed at [facility], [place] [address] [place], [place] [address]  Basic metabolic panel     Status: Abnormal   Collection Time: 08/08/21 12:45 AM  Result Value Ref Range   Sodium 137 135 - 145 mmol/L   Potassium 4.3 3.5 - 5.1 mmol/L   Chloride 106 98 - 111 mmol/L   CO2 22 22 - 32 mmol/L   Glucose, Bld 158 (H) 70 - 99 mg/dL    Comment: Glucose reference range applies only to samples taken after fasting for at least 8 hours.   BUN 14 6 - 20 mg/dL   Creatinine, Ser 1.21 0.61 - 1.24 mg/dL   Calcium 8.5 (L) 8.9 - 10.3 mg/dL   GFR, Estimated >60 >60 mL/min    Comment: (NOTE) Calculated using the CKD-EPI Creatinine Equation (2021)    Anion gap 9 5 - 15    Comment: Performed at [facility] [address], [place], [place]  [address]  Protime-INR     Status: None   Collection Time: 08/09/21  1:29 AM  Result Value Ref Range   Prothrombin Time 14.0 11.4 - 15.2 seconds   INR 1.1 0.8 - 1.2    Comment: (NOTE) INR goal varies based on device and disease states. Performed at [facility], [place] [address] [place], [place] [address]     Radiology/Results: No results found.  Anti-infectives: Anti-infectives (From admission, onward)    Start     Dose/Rate Route Frequency Ordered Stop   08/07/21 0830  ceFAZolin (ANCEF) IVPB 3g/100 mL premix       See Hyperspace for full Linked Orders Report.   3 g 200 mL/hr over 30 Minutes Intravenous On call to O.R. 08/07/21 0816 08/07/21 1038   08/07/21 0830  metroNIDAZOLE (FLAGYL) IVPB 500 mg       See Hyperspace for full Linked Orders Report.   500 mg 100 mL/hr over 60 Minutes Intravenous On call to O.R. 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  Will check INR  tomorrow 2 Days Post-Op    LOS: 2 days   Matt B. Hassell Done, MD, [facility] [phone] to reach the surgeon on call.    08/09/2021 9:50 AM

## 2021-08-09 NOTE — Progress Notes (Signed)
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 Per chart, patient took last home dose of warfarin on 1/22 and was bridged with lovenox prior to surgery. Patient was restarted on warfarin post surgery on 1/28 and received  10 MG x1. Current INR 1.1. No order for warfarin today, contacted on-call physician and will do another 10 MG x1 today via verbal order and follow up with INR in the AM.

## 2021-08-09 NOTE — Plan of Care (Signed)
°  Problem: Education: Goal: Knowledge of General Education information will improve Description: Including pain rating scale, medication(s)/side effects and non-pharmacologic comfort measures Outcome: Adequate for Discharge   Problem: Health Behavior/Discharge Planning: Goal: Ability to manage health-related needs will improve Outcome: Adequate for Discharge   Problem: Clinical Measurements: Goal: Ability to maintain clinical measurements within normal limits will improve Outcome: Progressing Goal: Will remain free from infection Outcome: Progressing Goal: Diagnostic test results will improve Outcome: Progressing Goal: Respiratory complications will improve Outcome: Adequate for Discharge Goal: Cardiovascular complication will be avoided Outcome: Adequate for Discharge   Problem: Activity: Goal: Risk for activity intolerance will decrease Outcome: Progressing   Problem: Nutrition: Goal: Adequate nutrition will be maintained Outcome: Progressing   Problem: Coping: Goal: Level of anxiety will decrease Outcome: Adequate for Discharge   Problem: Elimination: Goal: Will not experience complications related to bowel motility Outcome: Progressing Goal: Will not experience complications related to urinary retention Outcome: Adequate for Discharge

## 2021-08-10 ENCOUNTER — Encounter (HOSPITAL_COMMUNITY): Payer: Self-pay | Admitting: Surgery

## 2021-08-10 LAB — PROTIME-INR
INR: 1.3 — ABNORMAL HIGH (ref 0.8–1.2)
Prothrombin Time: 16.6 seconds — ABNORMAL HIGH (ref 11.4–15.2)

## 2021-08-10 MED ORDER — WARFARIN SODIUM 5 MG PO TABS
10.0000 mg | ORAL_TABLET | Freq: Every day | ORAL | Status: AC
  Administered 2021-08-10: 10 mg via ORAL
  Filled 2021-08-10: qty 2

## 2021-08-10 NOTE — Anesthesia Postprocedure Evaluation (Signed)
Anesthesia Post Note  Patient: [name]  Procedure(s) Performed: LAPAROSCOPY DIAGNOSTIC (Abdomen) GASTROSTOMY, EXCISION OF GASTRIC MASS (Abdomen)     Patient location during evaluation: PACU Anesthesia Type: General Level of consciousness: awake and alert Pain management: pain level controlled Vital Signs Assessment: post-procedure vital signs reviewed and stable Respiratory status: spontaneous breathing, nonlabored ventilation, respiratory function stable and patient connected to nasal cannula oxygen Cardiovascular status: blood pressure returned to baseline and stable Postop Assessment: no apparent nausea or vomiting Anesthetic complications: no   No notable events documented.  Last Vitals:  Vitals:   08/09/21 2022 08/10/21 0442  BP: 132/75 123/66  Pulse: 63 62  Resp: 17 17  Temp: 36.9 C 36.8 C  SpO2: 94% 93%    Last Pain:  Vitals:   08/10/21 0442  TempSrc: Oral  PainSc:                  Summer Mccolgan L Vince Ainsley

## 2021-08-10 NOTE — Progress Notes (Signed)
° ° °  3 Days Post-Op  Subjective: Patient is afebrile and hemodynamically stable. Pain well-controlled. Tolerating clear liquids, passing flatus. Ambulating without difficulty.   Objective: Vital signs in last 24 hours: Temp:  [98.2 F (36.8 C)-99.1 F (37.3 C)] 98.6 F (37 C) (01/30 0727) Pulse Rate:  [62-71] 69 (01/30 0727) Resp:  [16-19] 16 (01/30 0727) BP: (122-135)/(66-78) 122/73 (01/30 0727) SpO2:  [91 %-95 %] 91 % (01/30 0727) Last BM Date: 08/06/21  Intake/Output from previous day: 01/29 0701 - 01/30 0700 In: 1035.6 [P.O.:350; I.V.:685.6] Out: 800 [Urine:800] Intake/Output this shift: No intake/output data recorded.  PE: General: resting comfortably, NAD Neuro: alert and oriented, no focal deficits Resp: normal work of breathing on room air Abdomen: soft, nondistended, nontender to palpation. Incisions clean and dry with mild ecchymoses but no induration. Extremities: warm and well-perfused   Lab Results:  Recent Labs    08/08/21 0045  WBC 20.8*  HGB 13.5  HCT 42.2  PLT 251   BMET Recent Labs    08/08/21 0045  NA 137  K 4.3  CL 106  CO2 22  GLUCOSE 158*  BUN 14  CREATININE 1.21  CALCIUM 8.5*   PT/INR Recent Labs    08/09/21 0129 08/10/21 0342  LABPROT 14.0 16.6*  INR 1.1 1.3*   CMP     Component Value Date/Time   NA 137 08/08/2021 0045   NA 140 05/30/2019 1120   K 4.3 08/08/2021 0045   CL 106 08/08/2021 0045   CO2 22 08/08/2021 0045   GLUCOSE 158 (H) 08/08/2021 0045   BUN 14 08/08/2021 0045   BUN 15 05/30/2019 1120   CREATININE 1.21 08/08/2021 0045   CALCIUM 8.5 (L) 08/08/2021 0045   PROT 7.2 05/30/2019 1120   ALBUMIN 4.4 05/30/2019 1120   AST 42 (H) 05/30/2019 1120   ALT 54 (H) 05/30/2019 1120   ALKPHOS 94 05/30/2019 1120   BILITOT 0.4 05/30/2019 1120   GFRNONAA >60 08/08/2021 0045   GFRAA 99 05/30/2019 1120   Lipase  No results found for: LIPASE     Studies/Results: No results found.    Assessment/Plan 46 yo  male POD3 s/p open gastrotomy and enucleation of intraluminal gastric mass. - Advance to soft diet, SLIV - Continue Coumadin, daily INR  - Daily PPI - Ambulate TID - Multimodal pain control - VTE: Coumadin, continue lovenox ppx until INR is therapeutic - Dispo: inpatient, med-surg floor. Anticipate discharge home tomorrow.   LOS: 3 days    Michaelle Birks, MD [facility] General, Hepatobiliary and Pancreatic Surgery 08/10/21 7:57 AM

## 2021-08-11 LAB — PROTIME-INR
INR: 1.4 — ABNORMAL HIGH (ref 0.8–1.2)
Prothrombin Time: 17.1 seconds — ABNORMAL HIGH (ref 11.4–15.2)

## 2021-08-11 MED ORDER — ACETAMINOPHEN 500 MG PO TABS: 1000.0000 mg | ORAL_TABLET | Freq: Three times a day (TID) | ORAL | 0 refills | Status: AC | PRN

## 2021-08-11 MED ORDER — PANTOPRAZOLE SODIUM 40 MG PO TBEC: 40.0000 mg | DELAYED_RELEASE_TABLET | Freq: Every day | ORAL | 0 refills | Status: AC

## 2021-08-11 MED ORDER — DOCUSATE SODIUM 100 MG PO CAPS: 100.0000 mg | ORAL_CAPSULE | Freq: Two times a day (BID) | ORAL | 0 refills | Status: AC

## 2021-08-11 MED ORDER — METHOCARBAMOL 750 MG PO TABS: 750.0000 mg | ORAL_TABLET | Freq: Four times a day (QID) | ORAL | 0 refills | Status: AC | PRN

## 2021-08-11 MED ORDER — OXYCODONE HCL 5 MG PO TABS: 5.0000 mg | ORAL_TABLET | Freq: Four times a day (QID) | ORAL | 0 refills | Status: AC | PRN

## 2021-08-11 NOTE — Discharge Summary (Signed)
Physician Discharge Summary   Patient ID: Antonio Davis [id number] y.o. 09-06-1975  Admit date: 08/07/2021  Discharge date and time: 08/11/2021  Admitting Physician: Dwan Bolt, MD   Discharge Physician: Michaelle Birks, MD  Admission Diagnoses: Intraluminal gastric mass  Discharge Diagnoses: Intraluminal gastric mass  Admission Condition: good  Discharged Condition: good  Indication for Admission: Antonio Davis is a 46 yo male who was found to have a 3cm gastric mass in the antrum on workup for GI bleeding. EUS showed an intramural mass appearing to originate from the muscularis, and FNA was nondiagnostic. 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 After a discussion of the risks and benefits of surgery, the patient agreed to proceed with surgical excision.  Hospital Course: The patient was taken to the OR on 08/07/21 for a laparoscopic converted to open gastrotomy and excision of an intramural gastric mass. For further details of this portion of the procedure, please see separately dictated operative note.  Postoperatively the patient was admitted to the med-surg floor in stable condition.  He was started on a clear liquid diet.  On the morning of POD 1, he had no signs of active bleeding and his home Coumadin dosing was resumed.  He was continued on a prophylactic dose of Lovenox.  He was able to ambulate the next few days and he was advanced to a soft diet.  On the morning of POD 4, he was tolerating a soft diet and ambulating without difficulty.  His pain was controlled on oral medications and he was hemodynamically stable with no signs of bleeding.  He was passing flatus.  He was examined and deemed appropriate for discharge home.  He will continue on his home Coumadin dosing.  Consults: None  Significant Diagnostic Studies: None  Treatments: analgesia: acetaminophen and oxycodone and robaxin; and surgery: laparoscopic converted to open gastrotomy with excision of  intramural gastric mass  Discharge Exam: General: resting comfortably, NAD Neuro: alert and oriented, no focal deficits Resp: normal work of breathing on room air Abdomen: soft, nondistended, nontender to palpation. Incisions are clean and dry with some surrounding ecchymosis but no induration or drainage. Extremities: warm and well-perfused   Disposition: Discharge disposition: 01-Home or Self Care       Patient Instructions:  Allergies as of 08/11/2021       Reactions   Talwin [pentazocine] Nausea And Vomiting        Medication List     TAKE these medications    acetaminophen 500 MG tablet Commonly known as: TYLENOL Take 2 tablets (1,000 mg total) by mouth every 8 (eight) hours as needed for mild pain.   diphenhydrAMINE 25 MG tablet Commonly known as: BENADRYL Take 25 mg by mouth every 6 (six) hours as needed for allergies.   docusate sodium 100 MG capsule Commonly known as: COLACE Take 1 capsule (100 mg total) by mouth 2 (two) times daily.   enoxaparin 40 MG/0.4ML injection Commonly known as: LOVENOX Inject 40 mg into the skin 2 (two) times daily. Lovenox bridge for surgery   methocarbamol 750 MG tablet Commonly known as: ROBAXIN Take 1 tablet (750 mg total) by mouth every 6 (six) hours as needed for muscle spasms.   oxyCODONE 5 MG immediate release tablet Commonly known as: Oxy IR/ROXICODONE Take 1-2 tablets (5-10 mg total) by mouth every 6 (six) hours as needed (5mg  for moderate pain, 10mg  for severe pain).   pantoprazole 40 MG tablet Commonly known as: PROTONIX Take 1 tablet (  40 mg total) by mouth daily.   sennosides-docusate sodium 8.6-50 MG tablet Commonly known as: SENOKOT-S Take 2 tablets by mouth daily.   warfarin 5 MG tablet Commonly known as: COUMADIN Take as directed. If you are unsure how to take this medication, talk to your nurse or doctor. Original instructions: 10 MG MWF, SAT AND 5 MG TUE, THURS AND SUN   warfarin 10 MG  tablet Commonly known as: COUMADIN Take as directed. If you are unsure how to take this medication, talk to your nurse or doctor. Original instructions: Take 10 mg by mouth daily.       Activity: no heavy lifting for 6 weeks, do not travel taking narcotic pain medication. Diet:  Soft diet Wound Care:  Keep incisions clean and dry, okay to shower over incisions  Follow-up with Dr. Zenia Resides scheduled for 09/01/21 at 9:30am.  Signed: Dwan Bolt 08/11/2021 7:37 AM

## 2021-08-11 NOTE — Discharge Instructions (Addendum)
[facility] DISCHARGE INSTRUCTIONS  Activity No heavy lifting greater than 15 pounds for 6 weeks after surgery. Ok to shower in 24 hours, but do not bathe or submerge incisions underwater. Do not drive while taking narcotic pain medication.  Wound Care Your incisions are covered with skin glue called Dermabond. This will peel off on its own over time. You may shower and allow warm soapy water to run over your incisions. Gently pat dry. Do not submerge your incision underwater. Monitor your incision for any new redness, tenderness, or drainage.  When to Call us: Fever greater than 100.5 New redness, drainage, or swelling at incision site Severe pain, nausea, or vomiting Jaundice (yellowing of the whites of the eyes or skin)  Follow-up You have an appointment scheduled with Dr. Zenia Resides on September 01, 2021 at 9:30am. This will be at the [facility] office at [address], [place], [place], [place]. Please arrive at least 15 minutes prior to your scheduled appointment time.  For questions or concerns, please call the office at [phone].

## 2021-08-11 NOTE — Progress Notes (Signed)
Patient discharged to home with instructions and verbalized understanding. 

## 2021-08-24 ENCOUNTER — Encounter (HOSPITAL_COMMUNITY): Payer: Self-pay

## 2021-08-28 LAB — SURGICAL PATHOLOGY

## 2022-03-16 ENCOUNTER — Other Ambulatory Visit: Payer: Self-pay | Admitting: Surgery

## 2022-03-16 DIAGNOSIS — C499 Malignant neoplasm of connective and soft tissue, unspecified: Secondary | ICD-10-CM

## 2022-03-25 ENCOUNTER — Ambulatory Visit
Admission: RE | Admit: 2022-03-25 | Discharge: 2022-03-25 | Disposition: A | Discharge status: Home / Self Care | Payer: Commercial Managed Care - HMO | Source: Ambulatory Visit | Attending: Surgery | Admitting: Surgery

## 2022-03-25 DIAGNOSIS — C499 Malignant neoplasm of connective and soft tissue, unspecified: Secondary | ICD-10-CM

## 2022-03-25 MED ORDER — IOPAMIDOL (ISOVUE-300) INJECTION 61%
100.0000 mL | Freq: Once | INTRAVENOUS | Status: AC | PRN
  Administered 2022-03-25: 100 mL via INTRAVENOUS

## 2022-10-08 ENCOUNTER — Other Ambulatory Visit: Payer: Self-pay | Admitting: Internal Medicine

## 2022-10-08 DIAGNOSIS — D6859 Other primary thrombophilia: Secondary | ICD-10-CM

## 2022-10-09 ENCOUNTER — Inpatient Hospital Stay: Payer: Commercial Managed Care - HMO | Attending: Internal Medicine | Admitting: Internal Medicine

## 2022-10-09 ENCOUNTER — Inpatient Hospital Stay: Payer: Commercial Managed Care - HMO

## 2022-10-09 VITALS — BP 136/85 | HR 64 | Temp 97.9°F | Resp 20 | Wt 292.2 lb

## 2022-10-09 DIAGNOSIS — D6859 Other primary thrombophilia: Secondary | ICD-10-CM | POA: Insufficient documentation

## 2022-10-09 DIAGNOSIS — Z7901 Long term (current) use of anticoagulants: Secondary | ICD-10-CM | POA: Insufficient documentation

## 2022-10-09 DIAGNOSIS — I872 Venous insufficiency (chronic) (peripheral): Secondary | ICD-10-CM | POA: Diagnosis not present

## 2022-10-09 DIAGNOSIS — Z86718 Personal history of other venous thrombosis and embolism: Secondary | ICD-10-CM | POA: Insufficient documentation

## 2022-10-09 DIAGNOSIS — I82401 Acute embolism and thrombosis of unspecified deep veins of right lower extremity: Secondary | ICD-10-CM | POA: Diagnosis not present

## 2022-10-09 LAB — ANTITHROMBIN III: AntiThromb III Func: 120 % (ref 75–120)

## 2022-10-09 MED ORDER — APIXABAN (ELIQUIS) VTE STARTER PACK (10MG AND 5MG): ORAL_TABLET | ORAL | 0 refills | Status: DC

## 2022-10-09 NOTE — Progress Notes (Signed)
[place] [phone]   [phone]  CONSULT NOTE  REFERRING PHYSICIAN: Carles Collet, PA-C  REASON FOR CONSULTATION:  47 years old white male with hypercoagulable condition.  HPI Antonio Davis is a 47 y.o. 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  The patient also has irritable bowel syndrome. His DVT in 2010 was unprovoked.  He superficial phlebitis in 2019 following ankle surgery.  He was off Coumadin for 10 days during that time.  He was then treated with Lovenox followed by Coumadin. He does have extensive family history of deep venous thrombosis and PE including his paternal grandfather, his father, his brother as well as many paternal uncles and aunts.  On September 26, 2022 he was seen by his primary care provider complaining of 5 days history of focal redness and pain in the right medial calf and a Doppler was performed and it showed evidence of chronic occlusive changes noted in the posterior tibial vein the superficial saphenous vein had reflux noted in the proximal calf and distal calf and evidence of chronic nonocclusive changes noted from mid calf to distal calf.  His INR was 2.5 a week before the time of his diagnosis. The patient was referred to me today for evaluation and recommendation regarding his condition. When seen today he continues to have the swelling and tenderness in the right thigh and leg with hardening of the superficial veins.  He denied having any chest pain, shortness of breath, cough or hemoptysis.  He has no nausea, vomiting, diarrhea or constipation.  He has no headache or visual changes. Family history significant for several family members with deep venous thrombosis and pulmonary embolus including his paternal grandfather, his father, paternal uncles and aunts as well as his brother.  His mother died when he was  76. The patient is married and has 1 daughter age 37.  He lives in [place].  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  He has no history for smoking, alcohol or drug abuse.  HPI  Past Medical History:  Diagnosis Date   History of DVT (deep vein thrombosis)    R calf; 2010; on Coumadin since   IBS (irritable bowel syndrome)     Past Surgical History:  Procedure Laterality Date   ANKLE SURGERY     ANTERIOR CRUCIATE LIGAMENT REPAIR Bilateral    EXTERNAL FIXATION LEG Left 03/24/2019   Procedure: EXTERNAL FIXATION LEG;  Surgeon: Marchia Bond, MD;  Location: [facility];  Service: Orthopedics;  Laterality: Left;   EXTERNAL FIXATION REMOVAL Left 04/12/2019   Procedure: Removal External Fixation Leg;  Surgeon: Erle Crocker, MD;  Location: [facility];  Service: Orthopedics;  Laterality: Left;   KNEE ARTHROSCOPY W/ MENISCAL REPAIR     LAPAROSCOPIC GASTROSTOMY N/A 08/07/2021   Procedure: GASTROSTOMY, EXCISION OF GASTRIC MASS;  Surgeon: Dwan Bolt, MD;  Location: [facility];  Service: General;  Laterality: N/A;   LAPAROSCOPY N/A 08/07/2021   Procedure: LAPAROSCOPY DIAGNOSTIC;  Surgeon: Dwan Bolt, MD;  Location: [facility];  Service: General;  Laterality: N/A;   ORIF ANKLE FRACTURE Left 04/12/2019   Procedure: OPEN TREATMENT LEFT ANKLE AND SYNDESMOSIS ANKLE ARTHROTOMY;  Surgeon: Erle Crocker, MD;  Location: [facility];  Service: Orthopedics;  Laterality: Left;  SURGERY REQUEST TIME 2.5 HOURS   SHOULDER SURGERY     2/2  fracture   SYNDESMOSIS REPAIR Left 04/12/2019   Procedure: SYNDESMOSIS REPAIR;  Surgeon: Erle Crocker, MD;  Location: [facility];  Service: Orthopedics;  Laterality: Left;   VASECTOMY      Family History  Problem Relation Age of Onset   Clotting disorder Father    Clotting disorder Brother     Social History Social History   Tobacco Use   Smoking status: Never   Smokeless tobacco: Never  Vaping Use   Vaping Use: Never used  Substance Use Topics    Alcohol use: No   Drug use: No    Allergies  Allergen Reactions   Talwin [Pentazocine] Nausea And Vomiting    Current Outpatient Medications  Medication Sig Dispense Refill   acetaminophen (TYLENOL) 500 MG tablet Take 2 tablets (1,000 mg total) by mouth every 8 (eight) hours as needed for mild pain. 30 tablet 0   diphenhydrAMINE (BENADRYL) 25 MG tablet Take 25 mg by mouth every 6 (six) hours as needed for allergies.     docusate sodium (COLACE) 100 MG capsule Take 1 capsule (100 mg total) by mouth 2 (two) times daily. 10 capsule 0   enoxaparin (LOVENOX) 40 MG/0.4ML injection Inject 40 mg into the skin 2 (two) times daily. Lovenox bridge for surgery     methocarbamol (ROBAXIN) 750 MG tablet Take 1 tablet (750 mg total) by mouth every 6 (six) hours as needed for muscle spasms. 20 tablet 0   oxyCODONE (OXY IR/ROXICODONE) 5 MG immediate release tablet Take 1-2 tablets (5-10 mg total) by mouth every 6 (six) hours as needed (5mg  for moderate pain, 10mg  for severe pain). 30 tablet 0   pantoprazole (PROTONIX) 40 MG tablet Take 1 tablet (40 mg total) by mouth daily. 30 tablet 0   sennosides-docusate sodium (SENOKOT-S) 8.6-50 MG tablet Take 2 tablets by mouth daily. (Patient not taking: Reported on 07/28/2021) 30 tablet 1   warfarin (COUMADIN) 10 MG tablet Take 10 mg by mouth daily.     warfarin (COUMADIN) 5 MG tablet 10 MG MWF, SAT AND 5 MG TUE, THURS AND SUN (Patient not taking: Reported on 07/28/2021) 48 tablet 0   No current facility-administered medications for this visit.    Review of Systems  Constitutional: negative Eyes: negative Ears, nose, mouth, throat, and face: negative Respiratory: negative Cardiovascular: negative Gastrointestinal: negative Genitourinary:negative Integument/breast: negative Hematologic/lymphatic: negative Musculoskeletal:negative Neurological: negative Behavioral/Psych: negative Endocrine: negative Allergic/Immunologic: negative  Physical  Exam  [id number], healthy, no distress, well nourished, and well developed SKIN: skin color, texture, turgor are normal, no rashes or significant lesions HEAD: Normocephalic, No masses, lesions, tenderness or abnormalities EYES: normal, PERRLA, Conjunctiva are pink and non-injected EARS: External ears normal, Canals clear OROPHARYNX:no exudate, no erythema, and lips, buccal mucosa, and tongue normal  NECK: supple, no adenopathy, no JVD LYMPH:  no palpable lymphadenopathy, no hepatosplenomegaly BREAST:not examined LUNGS: clear to auscultation , and palpation HEART: regular rate & rhythm, no murmurs, and no gallops ABDOMEN:abdomen soft, non-tender, obese, normal bowel sounds, and no masses or organomegaly BACK: Back symmetric, no curvature., No CVA tenderness EXTREMITIES:positive findings:  edema 1+ and tender hard veins NEURO: alert & oriented x 3 with fluent speech, no focal motor/sensory deficits  PERFORMANCE STATUS: ECOG 1  LABORATORY DATA: Lab Results  Component Value Date   WBC 20.8 (H) 08/08/2021   HGB 13.5 08/08/2021   HCT 42.2 08/08/2021   MCV 86.8 08/08/2021   PLT 251 08/08/2021      Chemistry      Component  Value Date/Time   NA 137 08/08/2021 0045   NA 140 05/30/2019 1120   K 4.3 08/08/2021 0045   CL 106 08/08/2021 0045   CO2 22 08/08/2021 0045   BUN 14 08/08/2021 0045   BUN 15 05/30/2019 1120   CREATININE 1.21 08/08/2021 0045      Component Value Date/Time   CALCIUM 8.5 (L) 08/08/2021 0045   ALKPHOS 94 05/30/2019 1120   AST 42 (H) 05/30/2019 1120   ALT 54 (H) 05/30/2019 1120   BILITOT 0.4 05/30/2019 1120       RADIOGRAPHIC STUDIES: No results found.  ASSESSMENT: This is a very pleasant 47 years old white male with history of deep venous thrombosis in 2010 as well as several episodes of superficial phlebitis of the right lower extremity in 2019 when he was off Coumadin for 7 days and then again in 2024 while the patient was on therapeutic dose of  Coumadin.  The patient has a very strong family history of deep venous thrombosis and pulmonary embolus.  He is definitely at risk for recurrent deep venous thrombosis and pulmonary embolism.  He has been trying to lose weight and he has been eating more green recently and this was affecting his Coumadin dose and therapeutic level.   PLAN: I had a lengthy discussion with the patient today about his current condition and further investigation to identify the etiology of his underlying hypercoagulable condition as well as treatment options. I order hypercoagulable panel today.  It is understandable that some of the value may not be correct especially when he is taking Coumadin including protein S and protein C but will be looking for lupus anticoagulant as well as factor V Leiden. With a strong family of deep venous thrombosis in his personal history, I recommended for the patient to stay on anticoagulation for life.  I discussed with him switching his treatment from Coumadin to Eliquis starting with the starter dose of 10 mg p.o. twice daily for 7 days followed by 5 mg p.o. twice daily.  I will send a prescription today to his pharmacy and he will discontinue his Coumadin. I will see the patient back for follow-up visit in 3 months for evaluation and close monitoring. The patient was advised to call immediately if he has any other concerning symptoms in the interval. The patient voices understanding of current disease status and treatment options and is in agreement with the current care plan.  All questions were answered. The patient knows to call the clinic with any problems, questions or concerns. We can certainly see the patient much sooner if necessary.  Thank you so much for allowing me to participate in the care of TADD HNAT. I will continue to follow up the patient with you and assist in his care.  The total time spent in the appointment was 60 minutes.  Disclaimer: This note was dictated  with voice recognition software. Similar sounding words can inadvertently be transcribed and may not be corrected upon review.   Eilleen Kempf October 09, 2022, 8:09 AM

## 2022-10-11 ENCOUNTER — Other Ambulatory Visit: Payer: Self-pay | Admitting: Internal Medicine

## 2022-10-11 LAB — BETA-2-GLYCOPROTEIN I ABS, IGG/M/A
Beta-2 Glyco I IgG: <9 GPI IgG units (ref 0–20)
Beta-2-Glycoprotein I IgA: <9 GPI IgA units (ref 0–25)
Beta-2-Glycoprotein I IgM: <9 GPI IgM units (ref 0–32)

## 2022-10-11 MED ORDER — APIXABAN 5 MG PO TABS: 5.0000 mg | ORAL_TABLET | Freq: Two times a day (BID) | ORAL | 5 refills | Status: AC

## 2022-10-12 LAB — PTT-LA MIX: PTT-LA Mix: 52.7 s — ABNORMAL HIGH (ref 0.0–40.5)

## 2022-10-12 LAB — PROTEIN C, TOTAL: Protein C, Total: 59 % — ABNORMAL LOW (ref 60–150)

## 2022-10-12 LAB — DRVVT CONFIRM: dRVVT Confirm: 1.2 ratio (ref 0.8–1.2)

## 2022-10-12 LAB — HEXAGONAL PHASE PHOSPHOLIPID: Hexagonal Phase Phospholipid: 6 s (ref 0–11)

## 2022-10-12 LAB — DRVVT MIX: dRVVT Mix: 43.2 s — ABNORMAL HIGH (ref 0.0–40.4)

## 2022-10-12 LAB — PROTEIN C ACTIVITY: Protein C Activity: 56 % — ABNORMAL LOW (ref 73–180)

## 2022-10-12 LAB — PROTEIN S ACTIVITY: Protein S Activity: 11 % — ABNORMAL LOW (ref 63–140)

## 2022-10-12 LAB — PROTEIN S, TOTAL: Protein S Ag, Total: <25 % — ABNORMAL LOW (ref 60–150)

## 2022-10-12 LAB — LUPUS ANTICOAGULANT PANEL
DRVVT: 72.7 s — ABNORMAL HIGH (ref 0.0–47.0)
PTT Lupus Anticoagulant: 66.3 s — ABNORMAL HIGH (ref 0.0–43.5)

## 2022-10-13 LAB — CARDIOLIPIN ANTIBODIES, IGG, IGM, IGA
Anticardiolipin IgA: <9 APL U/mL (ref 0–11)
Anticardiolipin IgG: <9 GPL U/mL (ref 0–14)
Anticardiolipin IgM: <9 MPL U/mL (ref 0–12)

## 2022-10-13 LAB — HOMOCYSTEINE: Homocysteine: 16.3 umol/L — ABNORMAL HIGH (ref 0.0–14.5)

## 2022-10-21 LAB — PROTHROMBIN GENE MUTATION

## 2022-10-26 LAB — FACTOR 5 LEIDEN

## 2022-11-03 IMAGING — CT CT ABD-PELV W/ CM
1 of 3 series · 14 of 32 positions shown, 19 images · IV contrast (iopamidol)
Comparison: None.

CLINICAL DATA: GI stromal tumor showed mass at antrum

EXAM:
CT ABDOMEN AND PELVIS WITH CONTRAST
TECHNIQUE: Multidetector CT imaging of the abdomen and pelvis was performed
using the standard protocol following bolus administration of
intravenous contrast.
CONTRAST:  100mL 5RUMVZ-LCC IOPAMIDOL (5RUMVZ-LCC) INJECTION 61%

[Series 2: a/p w/ 5mm · axial · 0.98mm/px · z∈[-542,-77]mm · 14 of 105 slices shown, 19 images]
[im 6/105  soft-tissue]
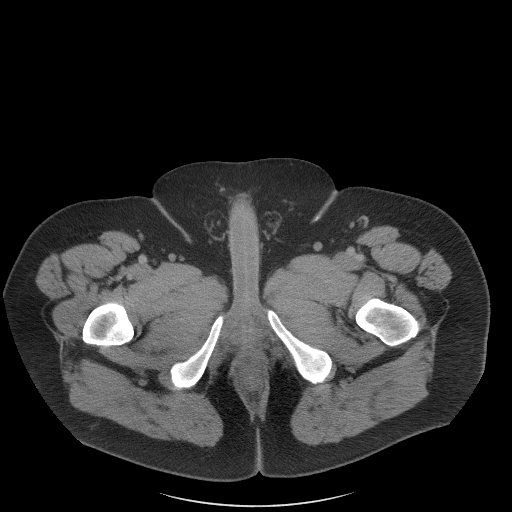
[im 6/105  bone]
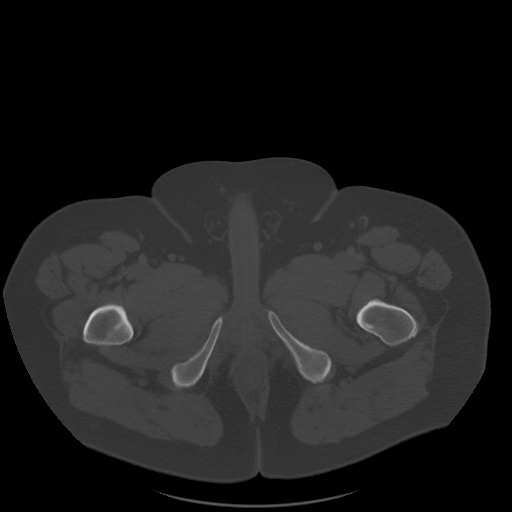
[im 12/105  soft-tissue]
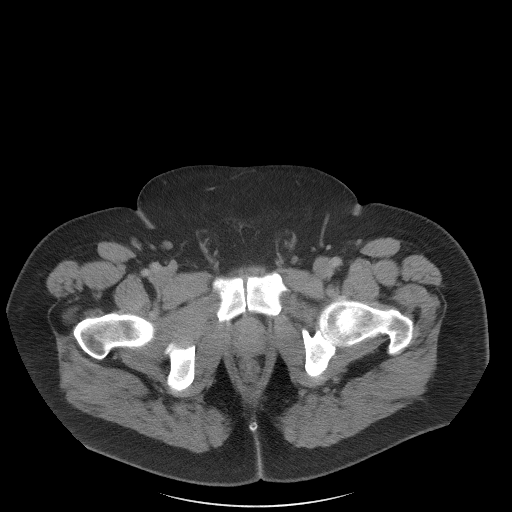
[im 24/105  soft-tissue]
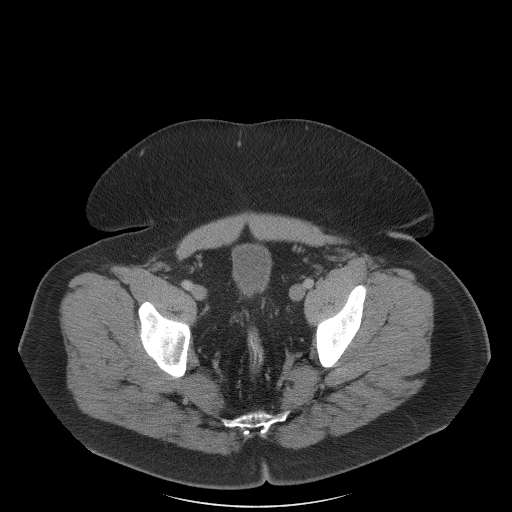
[im 29/105  soft-tissue]
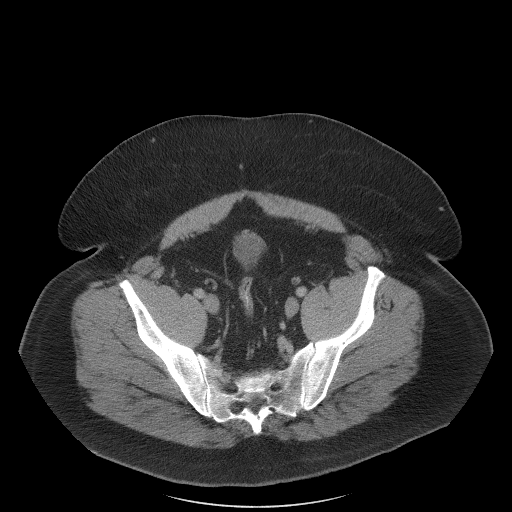
[im 35/105  soft-tissue]
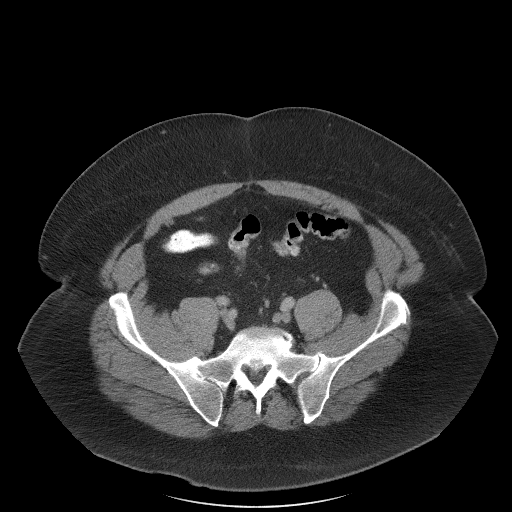
[im 47/105  soft-tissue]
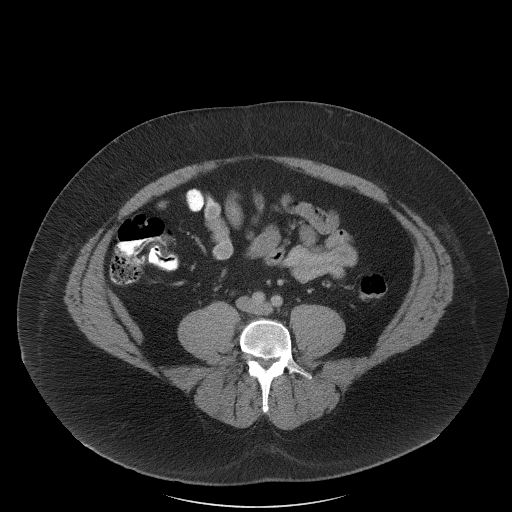
[im 53/105  soft-tissue]
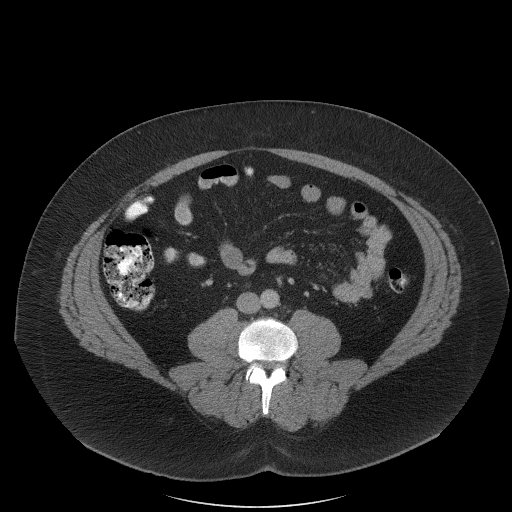
[im 58/105  soft-tissue]
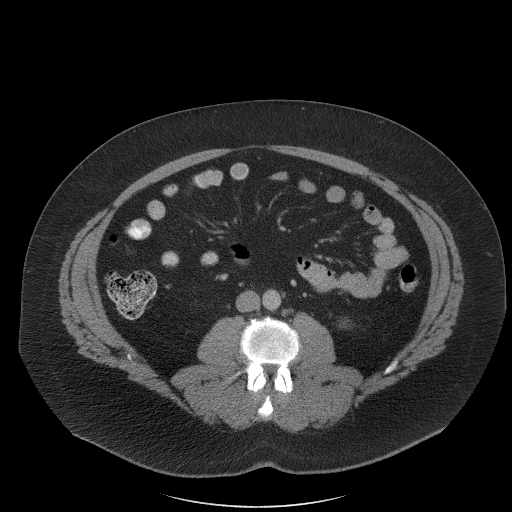
[im 70/105  soft-tissue]
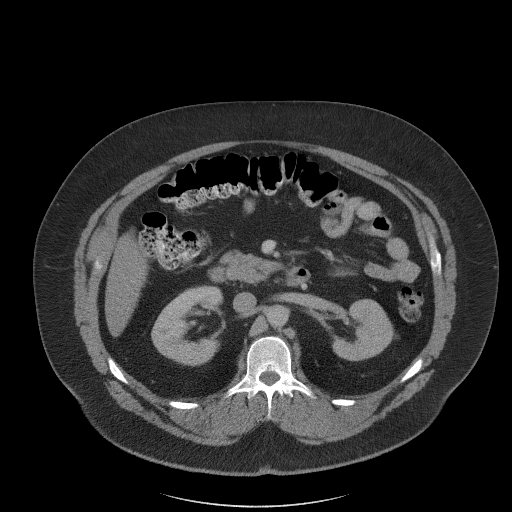
[im 70/105  bone]
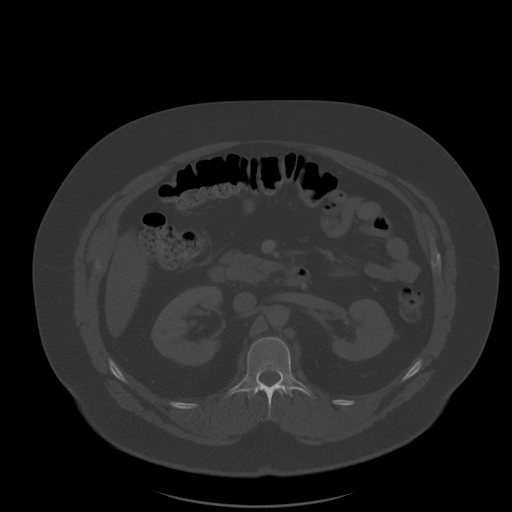
[im 76/105  soft-tissue]
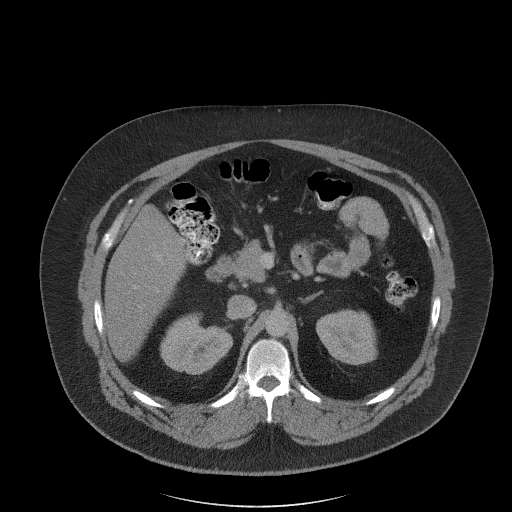
[im 81/105  soft-tissue]
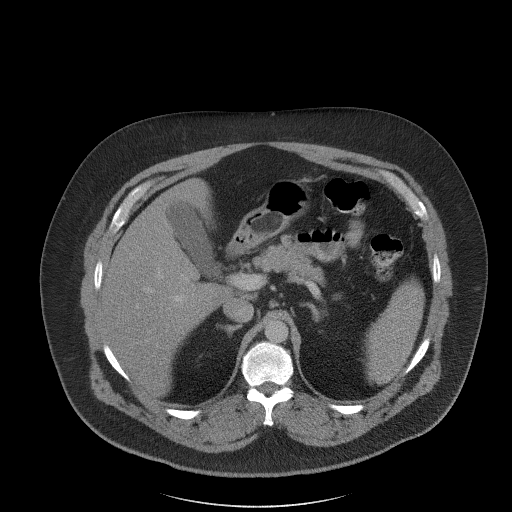
[im 81/105  lung]
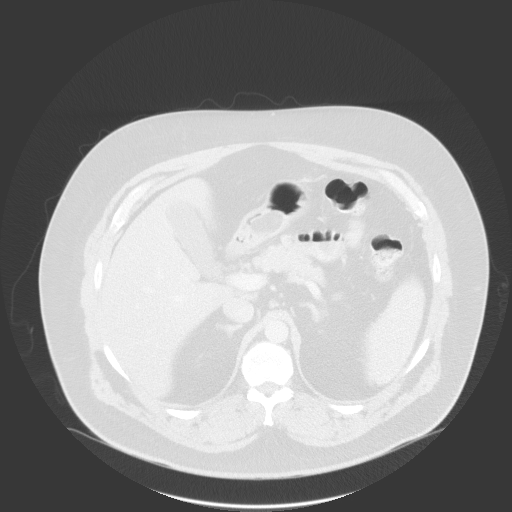
[im 87/105  lung]
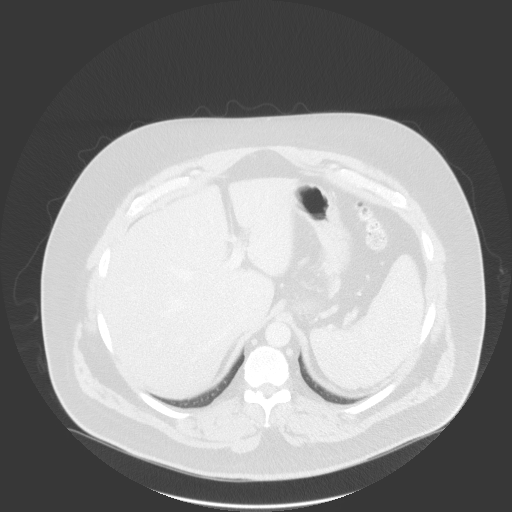
[im 93/105  soft-tissue]
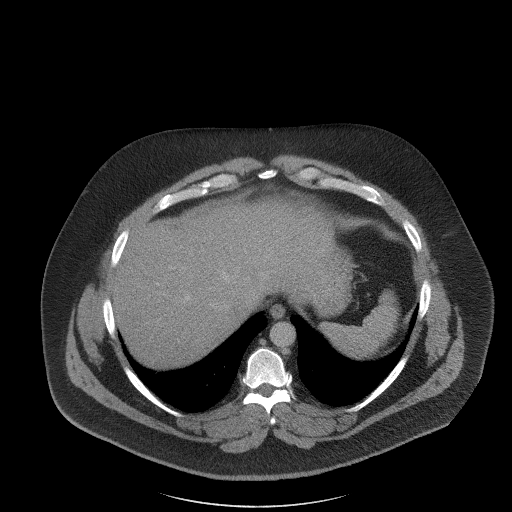
[im 93/105  lung]
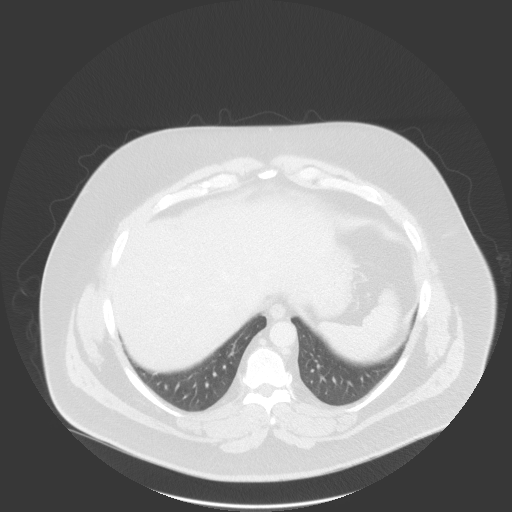
[im 99/105  soft-tissue]
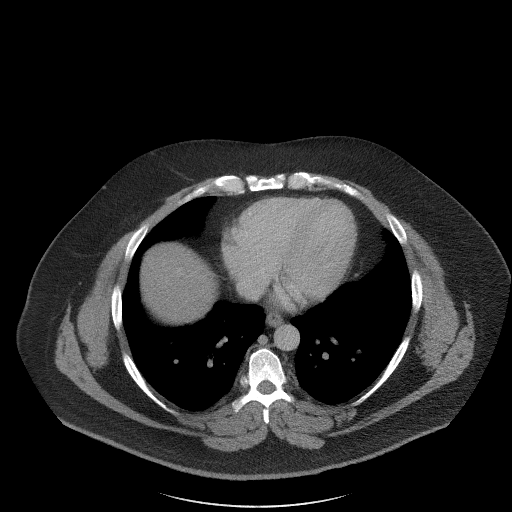
[im 99/105  lung]
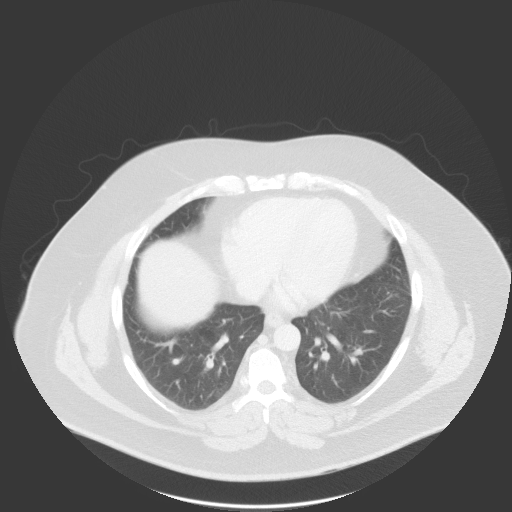

[14 of 32 positions shown; findings below may reference images not displayed]

FINDINGS: Lower chest: No acute abnormality.

Hepatobiliary: No focal liver abnormality is seen. No gallstones,
gallbladder wall thickening, or biliary dilatation.

Pancreas: Unremarkable. No pancreatic ductal dilatation or
surrounding inflammatory changes.

Spleen: Normal in size without focal abnormality.

Adrenals/Urinary Tract: Adrenal glands are unremarkable. Kidneys are
normal, without renal calculi, focal lesion, or hydronephrosis.
Bladder is unremarkable.

Stomach/Bowel: There is a soft tissue density mass in the posterior
wall of the gastric antrum (series 2, images 25-26) measuring
approximately 2.1 x 3.6 x 2.0 cm. Appendix appears normal. No
evidence of bowel wall thickening, distention, or inflammatory
changes.

Vascular/Lymphatic: No significant vascular findings are present. No
enlarged abdominal or pelvic lymph nodes.

Reproductive: Prostate is unremarkable.

Other: No abdominal wall hernia or abnormality. No abdominopelvic
ascites.

Musculoskeletal: No acute or significant osseous findings.
IMPRESSION: 1. Soft tissue density mass in the posterior wall of the gastric
antrum measuring at least 2.1 x 3.6 x 2.0 cm. This may represent a
gastrointestinal stromal tumor or gastric lipoma. Tissue diagnosis
for further evaluation would be helpful. No bulky lymphadenopathy.
No other appreciable tumor.
2. Bowel loops are normal in caliber. No evidence of colitis or
diverticulitis. Normal appendix.
